# Patient Record
Sex: Female | Born: 1980 | Race: White | Hispanic: No | Smoking: Former smoker
Health system: Southern US, Community
[De-identification: ages and names within clinical notes are randomized; demographics above are authoritative.]

## PROBLEM LIST (undated history)

## (undated) ENCOUNTER — Inpatient Hospital Stay (HOSPITAL_COMMUNITY): Payer: Self-pay

## (undated) DIAGNOSIS — R87629 Unspecified abnormal cytological findings in specimens from vagina: Secondary | ICD-10-CM

## (undated) DIAGNOSIS — G43909 Migraine, unspecified, not intractable, without status migrainosus: Secondary | ICD-10-CM

## (undated) DIAGNOSIS — B009 Herpesviral infection, unspecified: Secondary | ICD-10-CM

## (undated) HISTORY — DX: Unspecified abnormal cytological findings in specimens from vagina: R87.629

## (undated) HISTORY — PX: WISDOM TOOTH EXTRACTION: SHX21

## (undated) HISTORY — PX: THERAPEUTIC ABORTION: SHX798

---

## 2006-03-28 ENCOUNTER — Emergency Department (HOSPITAL_COMMUNITY): Admission: EM | Admit: 2006-03-28 | Discharge: 2006-03-28 | Payer: Self-pay | Admitting: Emergency Medicine

## 2006-10-31 ENCOUNTER — Emergency Department (HOSPITAL_COMMUNITY): Admission: EM | Admit: 2006-10-31 | Discharge: 2006-10-31 | Payer: Self-pay | Admitting: Emergency Medicine

## 2007-03-12 ENCOUNTER — Emergency Department (HOSPITAL_COMMUNITY): Admission: EM | Admit: 2007-03-12 | Discharge: 2007-03-12 | Payer: Self-pay | Admitting: Emergency Medicine

## 2007-09-27 ENCOUNTER — Emergency Department (HOSPITAL_COMMUNITY): Admission: EM | Admit: 2007-09-27 | Discharge: 2007-09-27 | Payer: Self-pay | Admitting: Emergency Medicine

## 2007-10-12 ENCOUNTER — Emergency Department (HOSPITAL_COMMUNITY): Admission: EM | Admit: 2007-10-12 | Discharge: 2007-10-13 | Payer: Self-pay | Admitting: Emergency Medicine

## 2007-10-15 ENCOUNTER — Telehealth: Payer: Self-pay | Admitting: *Deleted

## 2007-10-15 ENCOUNTER — Inpatient Hospital Stay (HOSPITAL_COMMUNITY): Admission: AD | Admit: 2007-10-15 | Discharge: 2007-10-15 | Payer: Self-pay | Admitting: Obstetrics & Gynecology

## 2007-10-15 LAB — CONVERTED CEMR LAB
Chlamydia, DNA Probe: NEGATIVE
GC Culture Only: NEGATIVE

## 2007-10-29 ENCOUNTER — Encounter: Payer: Self-pay | Admitting: Family Medicine

## 2007-10-29 ENCOUNTER — Ambulatory Visit: Payer: Self-pay | Admitting: Family Medicine

## 2007-10-29 LAB — CONVERTED CEMR LAB
Basophils Relative: 0 % (ref 0–1)
Eosinophils Absolute: 0.1 10*3/uL (ref 0.0–0.7)
Hepatitis B Surface Ag: NEGATIVE
Lymphs Abs: 2.3 10*3/uL (ref 0.7–4.0)
MCHC: 34.9 g/dL (ref 30.0–36.0)
MCV: 88 fL (ref 78.0–100.0)
Neutrophils Relative %: 64 % (ref 43–77)
Platelets: 200 10*3/uL (ref 150–400)
WBC: 8.9 10*3/uL (ref 4.0–10.5)

## 2007-11-05 ENCOUNTER — Encounter: Payer: Self-pay | Admitting: Family Medicine

## 2007-11-05 ENCOUNTER — Ambulatory Visit: Payer: Self-pay | Admitting: Family Medicine

## 2007-11-05 LAB — CONVERTED CEMR LAB

## 2007-11-06 ENCOUNTER — Encounter: Payer: Self-pay | Admitting: Family Medicine

## 2007-11-08 ENCOUNTER — Telehealth: Payer: Self-pay | Admitting: *Deleted

## 2007-11-25 ENCOUNTER — Telehealth: Payer: Self-pay | Admitting: *Deleted

## 2007-11-29 ENCOUNTER — Encounter: Payer: Self-pay | Admitting: Family Medicine

## 2007-11-29 ENCOUNTER — Ambulatory Visit (HOSPITAL_COMMUNITY): Admission: RE | Admit: 2007-11-29 | Discharge: 2007-11-29 | Payer: Self-pay | Admitting: Family Medicine

## 2007-12-09 ENCOUNTER — Ambulatory Visit: Payer: Self-pay | Admitting: Family Medicine

## 2007-12-09 ENCOUNTER — Encounter: Payer: Self-pay | Admitting: Family Medicine

## 2007-12-29 ENCOUNTER — Inpatient Hospital Stay (HOSPITAL_COMMUNITY): Admission: AD | Admit: 2007-12-29 | Discharge: 2007-12-30 | Payer: Self-pay | Admitting: Gynecology

## 2008-01-01 ENCOUNTER — Ambulatory Visit: Payer: Self-pay | Admitting: Family Medicine

## 2008-01-02 ENCOUNTER — Telehealth: Payer: Self-pay | Admitting: *Deleted

## 2008-01-02 ENCOUNTER — Encounter: Payer: Self-pay | Admitting: *Deleted

## 2008-01-06 ENCOUNTER — Telehealth: Payer: Self-pay | Admitting: *Deleted

## 2008-01-06 ENCOUNTER — Ambulatory Visit (HOSPITAL_COMMUNITY): Admission: RE | Admit: 2008-01-06 | Discharge: 2008-01-06 | Payer: Self-pay | Admitting: Family Medicine

## 2008-01-08 ENCOUNTER — Ambulatory Visit (HOSPITAL_COMMUNITY): Admission: RE | Admit: 2008-01-08 | Discharge: 2008-01-08 | Payer: Self-pay | Admitting: Family Medicine

## 2008-01-10 ENCOUNTER — Telehealth: Payer: Self-pay | Admitting: *Deleted

## 2008-01-14 ENCOUNTER — Encounter: Payer: Self-pay | Admitting: Family Medicine

## 2008-01-28 ENCOUNTER — Encounter: Payer: Self-pay | Admitting: Family Medicine

## 2008-01-28 ENCOUNTER — Ambulatory Visit: Payer: Self-pay | Admitting: Family Medicine

## 2008-02-17 ENCOUNTER — Telehealth: Payer: Self-pay | Admitting: Psychology

## 2008-03-03 ENCOUNTER — Encounter: Payer: Self-pay | Admitting: Family Medicine

## 2008-03-03 ENCOUNTER — Ambulatory Visit: Payer: Self-pay | Admitting: Family Medicine

## 2008-03-03 LAB — CONVERTED CEMR LAB: Glucose, Urine, Semiquant: NEGATIVE

## 2008-03-09 ENCOUNTER — Encounter: Payer: Self-pay | Admitting: Family Medicine

## 2008-03-09 ENCOUNTER — Ambulatory Visit: Payer: Self-pay | Admitting: Family Medicine

## 2008-03-23 ENCOUNTER — Telehealth: Payer: Self-pay | Admitting: Family Medicine

## 2008-03-25 ENCOUNTER — Telehealth: Payer: Self-pay | Admitting: *Deleted

## 2008-03-26 ENCOUNTER — Ambulatory Visit: Payer: Self-pay | Admitting: Family Medicine

## 2008-03-26 LAB — CONVERTED CEMR LAB

## 2008-03-31 ENCOUNTER — Telehealth: Payer: Self-pay | Admitting: Family Medicine

## 2008-04-03 ENCOUNTER — Telehealth: Payer: Self-pay | Admitting: *Deleted

## 2008-04-08 ENCOUNTER — Encounter: Payer: Self-pay | Admitting: Family Medicine

## 2008-04-08 ENCOUNTER — Ambulatory Visit: Payer: Self-pay | Admitting: Family Medicine

## 2008-04-08 LAB — CONVERTED CEMR LAB
Glucose, Urine, Semiquant: NEGATIVE
MCHC: 33.5 g/dL (ref 30.0–36.0)
MCV: 90 fL (ref 78.0–100.0)
Platelets: 189 10*3/uL (ref 150–400)

## 2008-04-13 ENCOUNTER — Ambulatory Visit (HOSPITAL_COMMUNITY): Admission: RE | Admit: 2008-04-13 | Discharge: 2008-04-13 | Payer: Self-pay | Admitting: Family Medicine

## 2008-04-13 ENCOUNTER — Encounter: Payer: Self-pay | Admitting: Family Medicine

## 2008-04-22 ENCOUNTER — Encounter: Payer: Self-pay | Admitting: Family Medicine

## 2008-04-24 ENCOUNTER — Telehealth: Payer: Self-pay | Admitting: *Deleted

## 2008-04-27 ENCOUNTER — Ambulatory Visit: Payer: Self-pay | Admitting: Family Medicine

## 2008-04-28 ENCOUNTER — Telehealth: Payer: Self-pay | Admitting: *Deleted

## 2008-05-13 ENCOUNTER — Encounter: Payer: Self-pay | Admitting: Family Medicine

## 2008-05-13 ENCOUNTER — Ambulatory Visit: Payer: Self-pay | Admitting: Family Medicine

## 2008-05-13 LAB — CONVERTED CEMR LAB: Chlamydia, DNA Probe: NEGATIVE

## 2008-05-14 ENCOUNTER — Encounter: Payer: Self-pay | Admitting: Family Medicine

## 2008-05-18 ENCOUNTER — Telehealth: Payer: Self-pay | Admitting: Family Medicine

## 2008-05-19 ENCOUNTER — Encounter: Payer: Self-pay | Admitting: Family Medicine

## 2008-05-19 ENCOUNTER — Ambulatory Visit: Payer: Self-pay | Admitting: Family Medicine

## 2008-05-20 ENCOUNTER — Ambulatory Visit (HOSPITAL_COMMUNITY): Admission: RE | Admit: 2008-05-20 | Discharge: 2008-05-20 | Payer: Self-pay | Admitting: Family Medicine

## 2008-05-20 ENCOUNTER — Telehealth: Payer: Self-pay | Admitting: Family Medicine

## 2008-05-22 ENCOUNTER — Telehealth (INDEPENDENT_AMBULATORY_CARE_PROVIDER_SITE_OTHER): Payer: Self-pay | Admitting: Family Medicine

## 2008-05-29 ENCOUNTER — Ambulatory Visit: Payer: Self-pay | Admitting: Family Medicine

## 2008-06-05 ENCOUNTER — Ambulatory Visit: Payer: Self-pay | Admitting: Family Medicine

## 2008-06-11 ENCOUNTER — Encounter (INDEPENDENT_AMBULATORY_CARE_PROVIDER_SITE_OTHER): Payer: Self-pay | Admitting: *Deleted

## 2008-06-12 ENCOUNTER — Ambulatory Visit: Payer: Self-pay | Admitting: Obstetrics & Gynecology

## 2008-06-12 ENCOUNTER — Inpatient Hospital Stay (HOSPITAL_COMMUNITY): Admission: RE | Admit: 2008-06-12 | Discharge: 2008-06-16 | Payer: Self-pay | Admitting: Family Medicine

## 2008-06-13 ENCOUNTER — Encounter: Payer: Self-pay | Admitting: Obstetrics & Gynecology

## 2008-06-22 ENCOUNTER — Telehealth: Payer: Self-pay | Admitting: Family Medicine

## 2008-07-02 ENCOUNTER — Telehealth: Payer: Self-pay | Admitting: *Deleted

## 2008-07-23 ENCOUNTER — Ambulatory Visit: Payer: Self-pay | Admitting: Family Medicine

## 2008-09-02 ENCOUNTER — Telehealth: Payer: Self-pay | Admitting: Family Medicine

## 2008-09-03 ENCOUNTER — Ambulatory Visit: Payer: Self-pay | Admitting: Family Medicine

## 2008-09-03 DIAGNOSIS — G43009 Migraine without aura, not intractable, without status migrainosus: Secondary | ICD-10-CM | POA: Insufficient documentation

## 2008-09-15 ENCOUNTER — Telehealth: Payer: Self-pay | Admitting: Family Medicine

## 2008-09-21 ENCOUNTER — Telehealth (INDEPENDENT_AMBULATORY_CARE_PROVIDER_SITE_OTHER): Payer: Self-pay | Admitting: *Deleted

## 2008-10-12 ENCOUNTER — Ambulatory Visit: Payer: Self-pay | Admitting: Family Medicine

## 2008-10-12 DIAGNOSIS — E669 Obesity, unspecified: Secondary | ICD-10-CM

## 2008-10-12 DIAGNOSIS — N912 Amenorrhea, unspecified: Secondary | ICD-10-CM

## 2008-10-12 LAB — CONVERTED CEMR LAB
Beta hcg, urine, semiquantitative: NEGATIVE
Blood in Urine, dipstick: NEGATIVE
Glucose, Urine, Semiquant: NEGATIVE
Nitrite: NEGATIVE
Specific Gravity, Urine: 1.02
pH: 5.5

## 2008-12-16 ENCOUNTER — Ambulatory Visit: Payer: Self-pay | Admitting: Family Medicine

## 2008-12-16 ENCOUNTER — Encounter: Payer: Self-pay | Admitting: Family Medicine

## 2008-12-16 ENCOUNTER — Telehealth (INDEPENDENT_AMBULATORY_CARE_PROVIDER_SITE_OTHER): Payer: Self-pay | Admitting: *Deleted

## 2008-12-16 ENCOUNTER — Other Ambulatory Visit: Admission: RE | Admit: 2008-12-16 | Discharge: 2008-12-16 | Payer: Self-pay | Admitting: Family Medicine

## 2008-12-16 DIAGNOSIS — N898 Other specified noninflammatory disorders of vagina: Secondary | ICD-10-CM | POA: Insufficient documentation

## 2008-12-16 DIAGNOSIS — M79609 Pain in unspecified limb: Secondary | ICD-10-CM

## 2008-12-16 DIAGNOSIS — K5900 Constipation, unspecified: Secondary | ICD-10-CM | POA: Insufficient documentation

## 2008-12-16 LAB — CONVERTED CEMR LAB: Whiff Test: NEGATIVE

## 2008-12-19 ENCOUNTER — Telehealth: Payer: Self-pay | Admitting: Family Medicine

## 2008-12-30 ENCOUNTER — Telehealth: Payer: Self-pay | Admitting: Family Medicine

## 2009-01-21 ENCOUNTER — Telehealth: Payer: Self-pay | Admitting: *Deleted

## 2009-08-12 ENCOUNTER — Emergency Department (HOSPITAL_COMMUNITY): Admission: EM | Admit: 2009-08-12 | Discharge: 2009-08-13 | Payer: Self-pay | Admitting: Emergency Medicine

## 2009-09-16 ENCOUNTER — Encounter: Payer: Self-pay | Admitting: Family Medicine

## 2009-09-17 ENCOUNTER — Telehealth: Payer: Self-pay | Admitting: *Deleted

## 2010-07-18 ENCOUNTER — Encounter: Payer: Self-pay | Admitting: Family Medicine

## 2010-07-26 NOTE — Miscellaneous (Signed)
  Clinical Lists Changes  Medications: Removed medication of AMITRIPTYLINE HCL 25 MG TABS (AMITRIPTYLINE HCL) Take one tablet at bedtime Removed medication of FIORICET 50-325-40 MG TABS (BUTALBITAL-APAP-CAFFEINE) one to two tabs by mouth q4 hours as needed headache. max 6 tabs in 24 hours. Removed medication of ZOMIG 2.5 MG TABS (ZOLMITRIPTAN) one tab by mouth q2 hours as needed Migraine. max 4 tabs in 24 hours. dispense 1 month supply

## 2010-07-26 NOTE — Progress Notes (Signed)
Summary: refill  Phone Note Refill Request Call back at Home Phone [phone] Message from:  Patient  Refills Requested: Medication #1:  ORTHO-CEPT (28) 0.15-30 MG-MCG TABS one tab by mouth daily per package instructions. dispense 1 pack. [facility] [phone]  Initial call taken by: De Nurse,  September 17, 2009 10:10 AM  Follow-up for Phone Call        please call in to pharmacy Follow-up by: Lequita Asal  MD,  September 17, 2009 12:55 PM    Prescriptions: ORTHO-CEPT (28) 0.15-30 MG-MCG TABS (DESOGESTREL-ETHINYL ESTRADIOL) one tab by mouth daily per package instructions. dispense 1 pack  #1 x 3   Entered and Authorized by:   Lequita Asal  MD   Signed by:   Lequita Asal  MD on 09/17/2009   Method used:   Telephoned to ...       [facility] (retail)       [address]       [place], [place]  [address]       Ph: [phone] or [phone]       Fax: [phone]   RxID:   [id number]  rx phone in and pt called.Loralee Pacas CMA  September 17, 2009 2:04 PM

## 2010-11-08 NOTE — Op Note (Signed)
[name], [name]                ACCOUNT NO.:  [id number]   MEDICAL RECORD NO.:  [id number]          PATIENT TYPE:  INP   LOCATION:  9102                          [facility]ILITY:  [facility]   PHYSICIAN:  Norton Blizzard, MD    DATE OF BIRTH:  10-28-1980   DATE OF PROCEDURE:  06/13/2008  DATE OF DISCHARGE:                               OPERATIVE REPORT   PREOPERATIVE DIAGNOSES:  1. Intrauterine pregnancy at 2 and 2/7th weeks' gestation.  2. Failed induction of labor for post dates.  3. Transverse presentation.   POSTOPERATIVE DIAGNOSES:  1. Intrauterine pregnancy at 70 and 2/7th weeks' gestation.  2. Failed induction of labor for post dates.  3. Transverse presentation.   PROCEDURES:  1. Primary low transverse cesarean section via Pfannenstiel incision.  2. Internal cephalic version.   SURGEON:  Norton Blizzard, MD.   ASSISTANT:  Cam Hai, Certified Nurse Midwife.   ANESTHESIA:  Spinal.   IV FLUIDS:  2100 mL of lactated Ringer's.   URINE OUTPUT:  200 mL.   ESTIMATED BLOOD LOSS:  900 mL.   INDICATIONS:  The patient is a 30 year old gravida 6, para 1-0-4-1 who  was admitted at 60 and 2/7th weeks for induction for post dates and  variable lie.  At the time of her admission, the patient was noted to  have a transverse fetal presentation with an anterior placenta.  She  underwent external cephalic version, which was reinforced with an  abdominal binder and induction with Cytotec commenced.  Throughout the  day, the patient progressed to a cervical dilatation of 1 cm, but the  fetal lie was noted to go back to a transverse presentation.  Given the  fetal variable presentation, the patient was counseled regarding the  need of cesarean section.  The patient was also given the option given  that she was only 40 and 2/7th weeks, to return in a week to retry  induction of labor, and retry external cephalic version if the fetus was  still transverse at this time.  The patient wanted to  proceed with  primary cesarean section today.  The risks of cesarean section including  but not limited to bleeding that may require transfusion, infection that  may require antibiotics, injury to surrounding organs, and need for  additional procedures including hysterectomy in the event of a life-  threatening bleed, risk of having a vertical incision on the uterus,  which would negate further vaginal deliveries after this delivery were  explained to the patient and written informed consent was obtained.   FINDINGS:  Viable female infant in transverse presentation, vertex on  the left, back down.  This was successfully converted to a cephalic  presentation after an internal cephalic version prior to delivery.  Apgars of the baby were 8 and 9, weight 8 pounds 2 ounces.  There was  spontaneous placental delivery, intact with 3-vessel cord.  Normal  uterus, fallopian tubes, and ovaries bilaterally.   SPECIMENS:  Placenta.   DISPOSITION:  Specimens to Pathology.   COMPLICATIONS:  None immediately after the procedure.   PROCEDURE DETAILS:  The patient was taken to the operating room where  spinal anesthesia was administered and found to be adequate.  She was  also given 2 g of Ancef for infection prophylaxis.  The patient was then  placed in a dorsal supine position with a leftward tilt, and prepped and  draped in a sterile manner.  A Foley catheter was inserted into the  patient's bladder and attached to constant gravity.  Attention was then  turned to the patient's abdomen where a Pfannenstiel incision was made  with a scalpel and carried through to the underlying layer of fascia.  The fascia was incised in midline and this incision was extended  bilaterally using Mayo scissors.  Kochers were then applied to the  superior aspect of this fascial incision and the underlying rectus  muscles were dissected off sharply and bluntly.  A similar process was  carried out on inferior aspect of  the fascial incision.  The rectus  muscles were separated in the midline bluntly and the peritoneum was  entered bluntly.  The perineal incision was extended superiorly and  inferiorly with good visualization of bowel and bladder.  At this point,  an internal cephalic version was done moving the fetal head from the  maternal left position into a cephalic presentation.  A bladder flap was  then done on the lower uterine segment and a transverse hysterotomy was  made using a scalpel and extended bilaterally in a blunt fashion.  Of  note, during the time between the version and when the hysterotomy was  completed, the infant was noted to go back into a transverse position,  so prior to the membranes being ruptured, another version was done to  bring the fetal head down towards the incision and this was successful.  The membranes were then ruptured for clear fluid and the infant was  delivered in the cephalic presentation without any complication.  The  cord was clamped and cut ,and the infant was handed over to the awaiting  neonatologist.  Cord blood was collected according to protocol.  The  placenta was noted to deliver spontaneously and had a 3-vessel cord.  The uterus was exteriorized and cleared of all clots and debris.  The  hysterotomy was repaired with 0 Monocryl in a running locked fashion.  A  second layer of Monocryl was used as an imbricating layer to obtain  hemostasis.  Of note, there was some bleeding around the bladder flap  area, so the bladder flap was reapproximated to help with this bleeding.  Overall, good hemostasis was noted.  The uterus was returned to the  abdomen.  The pelvic gutters were cleared of all clot and debris and  hemostasis was confirmed on all surfaces.  The fascia was then  reapproximated using 0 Vicryl in a running fashion, and the skin was  closed with staples and a pressure dressing was placed at the end of the  case.  The patient tolerated the  procedure well.  Sponge, needle, and  instrument counts were correct  x 2.  She was taken to the recovery  room, awake, and in stable condition.      Norton Blizzard, MD  Electronically Signed     UAD/MEDQ  D:  06/13/2008  T:  06/13/2008  Job:  [id number]

## 2010-11-11 NOTE — Discharge Summary (Signed)
Amy [name], Amy [name]                ACCOUNT NO.:  [id number]   MEDICAL RECORD NO.:  [id number]          PATIENT TYPE:  INP   LOCATION:  9102                          [facility]ILITY:  [facility]   PHYSICIAN:  Lazaro Arms, M.D.   DATE OF BIRTH:  [birth year]   DATE OF ADMISSION:  06/12/2008  DATE OF DISCHARGE:  06/16/2008                               DISCHARGE SUMMARY   PRIMARY CARE Alline Pio:  Dr. Lequita Asal of [facility].   ATTENDING PHYSICIAN:  Dr. Duane Lope   ADMISSION DIAGNOSES:  1. Intrauterine pregnancy at 40 weeks and 1 day gestational age.  2. Induction of labor secondary to postdates, gestational age.   DISCHARGE DIAGNOSES:  1. Postoperative day #3, status post primary low transverse cesarean      section at 40 weeks and 2 days.  2. Intrauterine pregnancy with fetus in transverse position.   PROCEDURES:  1. Failed induction of labor secondary to postdates.  2. Cephalic version of the fetus.  3. Internal cephalic version of the fetus.  4. Primary low transverse cesarean section secondary to unstable      lie/transverse presentation.   HISTORY OF PRESENT ILLNESS:  The patient is a 30 year old G6, P2-0-4-2,  was admitted at 40 weeks and 1 day gestational age for induction of  labor, for postdates.  The patient was felt having a nonfavorable  cervix.  Upon cervical exam, because the fetal presenting part was  unable tdeterminedmined, with bedside ultrasound, patient's fetus was  found to be in transverse position.  External version was attempted and  successful by Dr. Despina Hidden and the patient was placed in abdominal binder  overnight.  Cytotec was placed x2 to ripen the cervix.  On the morning  of June 13, 2008, prior to placement of Foley bulb bedside  ultrasound was repeated and fetus was again noted to be in the tranverse  position.  Options were discussed with the patient and she decided to  continue with a cesarean section secondary to unstable lie.  The  patient  and fetus tolerated the procedure well.  The patient delivered a viable  female infant with Apgars 8 and 9, weight of 8 pounds 2 ounces.  The  patient had an unremarkable postoperative course.  She was deemed stable  for discharge on postoperative day #3.  She was breastfeeding and had  not yet decided upon the mode of birth control.  She is followed at the  [facility] at 6 weeks.   DISCHARGE MEDICATIONS:  1. Percocet 5/325 one to two tablets p.o. q.4-6 h. p.r.n. pain.  2. Colace 100 mg p.o. b.i.d.  3. Ibuprofen 600 mg p.o. q.6 h. p.r.n. pain.   DISCHARGE INSTRUCTIONS:  No heavy lifting x6 weeks, nothing  intravaginally x6 weeks.      Lauro Franklin, MD      Lazaro Arms, M.D.  Electronically Signed    TCB/MEDQ  D:  06/18/2008  T:  06/19/2008  Job:  [id number]

## 2011-03-21 LAB — WET PREP, GENITAL: Yeast Wet Prep HPF POC: NONE SEEN

## 2011-03-21 LAB — CBC
HCT: 37.9
MCV: 87
Platelets: 226
RBC: 4.35
WBC: 9.4

## 2011-03-21 LAB — URINALYSIS, ROUTINE W REFLEX MICROSCOPIC
Nitrite: NEGATIVE
Protein, ur: NEGATIVE
Specific Gravity, Urine: >1.03 — ABNORMAL HIGH
Urobilinogen, UA: 0.2

## 2011-03-21 LAB — POCT PREGNANCY, URINE
Operator id: 22226
Preg Test, Ur: POSITIVE

## 2011-03-21 LAB — HCG, QUANTITATIVE, PREGNANCY: hCG, Beta Chain, Quant, S: 34010 — ABNORMAL HIGH

## 2011-03-23 LAB — CBC
HCT: 35.4 — ABNORMAL LOW
Hemoglobin: 12.4
MCHC: 35.1
MCV: 91
RBC: 3.89
WBC: 9.9

## 2011-03-28 LAB — GLUCOSE, CAPILLARY: Glucose-Capillary: 104 — ABNORMAL HIGH

## 2011-03-31 LAB — CBC
HCT: 27.4 % — ABNORMAL LOW (ref 36.0–46.0)
HCT: 36.2 % (ref 36.0–46.0)
Hemoglobin: 12.5 g/dL (ref 12.0–15.0)
Hemoglobin: 9.6 g/dL — ABNORMAL LOW (ref 12.0–15.0)
Hemoglobin: 9.7 g/dL — ABNORMAL LOW (ref 12.0–15.0)
MCHC: 34.4 g/dL (ref 30.0–36.0)
MCHC: 35 g/dL (ref 30.0–36.0)
MCV: 92.1 fL (ref 78.0–100.0)
MCV: 92.6 fL (ref 78.0–100.0)
Platelets: 125 10*3/uL — ABNORMAL LOW (ref 150–400)
Platelets: 134 10*3/uL — ABNORMAL LOW (ref 150–400)
RBC: 3.93 MIL/uL (ref 3.87–5.11)
RDW: 14.1 % (ref 11.5–15.5)
RDW: 14.4 % (ref 11.5–15.5)
WBC: 11.3 10*3/uL — ABNORMAL HIGH (ref 4.0–10.5)

## 2011-03-31 LAB — RPR: RPR Ser Ql: NONREACTIVE

## 2012-09-24 ENCOUNTER — Emergency Department (HOSPITAL_COMMUNITY)
Admission: EM | Admit: 2012-09-24 | Discharge: 2012-09-24 | Disposition: A | Discharge status: Home / Self Care | Payer: Self-pay | Attending: Emergency Medicine | Admitting: Emergency Medicine

## 2012-09-24 ENCOUNTER — Encounter (HOSPITAL_COMMUNITY): Payer: Self-pay | Admitting: Emergency Medicine

## 2012-09-24 DIAGNOSIS — Z87891 Personal history of nicotine dependence: Secondary | ICD-10-CM | POA: Insufficient documentation

## 2012-09-24 DIAGNOSIS — E860 Dehydration: Secondary | ICD-10-CM | POA: Insufficient documentation

## 2012-09-24 DIAGNOSIS — R109 Unspecified abdominal pain: Secondary | ICD-10-CM | POA: Insufficient documentation

## 2012-09-24 DIAGNOSIS — R112 Nausea with vomiting, unspecified: Secondary | ICD-10-CM | POA: Insufficient documentation

## 2012-09-24 DIAGNOSIS — R197 Diarrhea, unspecified: Secondary | ICD-10-CM | POA: Insufficient documentation

## 2012-09-24 HISTORY — DX: Migraine, unspecified, not intractable, without status migrainosus: G43.909

## 2012-09-24 MED ORDER — SODIUM CHLORIDE 0.9 % IV BOLUS (SEPSIS)
1000.0000 mL | Freq: Once | INTRAVENOUS | Status: AC
  Administered 2012-09-24: 1000 mL via INTRAVENOUS

## 2012-09-24 MED ORDER — PROMETHAZINE HCL 25 MG PO TABS: 25.0000 mg | ORAL_TABLET | Freq: Four times a day (QID) | ORAL | Status: DC | PRN

## 2012-09-24 MED ORDER — DIPHENOXYLATE-ATROPINE 2.5-0.025 MG PO TABS: 1.0000 | ORAL_TABLET | Freq: Four times a day (QID) | ORAL | Status: DC | PRN

## 2012-09-24 NOTE — ED Provider Notes (Signed)
History     CSN: [id number]  Arrival date & time 09/24/12  1312   First MD Initiated Contact with Patient 09/24/12 1341      No chief complaint on file.   (Consider location/radiation/quality/duration/timing/severity/associated sxs/prior treatment) HPI Comments: Patient presents to the ER for evaluation of diarrhea and abdominal cramping. Symptoms began last night. Patient reports that she was up all night with watery diarrhea. She has nausea but no vomiting. Patient's daughter is seen today for GI symptoms as well. Wife reports that her husband also was recently sick with the same symptoms. Patient does feel somewhat better today, but was unable to go to work.   No past medical history on file.  No past surgical history on file.  No family history on file.  History  Substance Use Topics  . Smoking status: Not on file  . Smokeless tobacco: Not on file  . Alcohol Use: Not on file    OB History   No data available      Review of Systems  Constitutional: Negative for fever.  Gastrointestinal: Positive for nausea and diarrhea. Negative for vomiting and anal bleeding.  All other systems reviewed and are negative.    Allergies  Review of patient's allergies indicates no known allergies.  Home Medications   Current Outpatient Rx  Name  Route  Sig  Dispense  Refill  . Attapulgite (KAOPECTATE PO)   Oral   Take 2 tablets by mouth every 6 (six) hours as needed (UPSET STOMACH).           BP 114/79  Pulse 125  Temp(Src) 98.2 F (36.8 C) (Oral)  Resp 20  SpO2 99%  Physical Exam  Constitutional: She is oriented to person, place, and time. She appears well-developed and well-nourished. No distress.  HENT:  Head: Normocephalic and atraumatic.  Right Ear: Hearing normal.  Nose: Nose normal.  Mouth/Throat: Oropharynx is clear and moist and mucous membranes are normal.  Eyes: Conjunctivae and EOM are normal. Pupils are equal, round, and reactive to light.  Neck:  Normal range of motion. Neck supple.  Cardiovascular: Normal rate, regular rhythm, S1 normal and S2 normal.  Exam reveals no gallop and no friction rub.   No murmur heard. Pulmonary/Chest: Effort normal and breath sounds normal. No respiratory distress. She exhibits no tenderness.  Abdominal: Soft. Normal appearance and bowel sounds are normal. There is no hepatosplenomegaly. There is no tenderness. There is no rebound, no guarding, no tenderness at McBurney's point and negative Murphy's sign. No hernia.  Musculoskeletal: Normal range of motion.  Neurological: She is alert and oriented to person, place, and time. She has normal strength. No cranial nerve deficit or sensory deficit. Coordination normal. GCS eye subscore is 4. GCS verbal subscore is 5. GCS motor subscore is 6.  Skin: Skin is warm, dry and intact. No rash noted. No cyanosis.  Psychiatric: She has a normal mood and affect. Her speech is normal and behavior is normal. Thought content normal.    ED Course  Procedures (including critical care time)  Labs Reviewed - No data to display No results found.   Diagnosis: Diarrhea    MDM  Patient presents to the ER for voluminous watery nonbloody diarrhea through the night. Patient has 2 other family members in the home with similar GI symptoms. This is consistent with a viral process. There is a very prevalent viral gastroenteritis in the community currently. Patient's abdominal exam was benign and nontender. Clinically her pressure was normal. With orthostatic  blood pressures, blood pressure did not change, but her heart rate did significantly increase, indicating mild to moderate dehydration. Patient was therefore administered a liter of IV fluids.        Gilda Crease, MD 09/24/12 1438

## 2012-09-24 NOTE — ED Notes (Signed)
Pt here for diarrhea/vomiting x3 day not relieved with Otc meds

## 2015-09-19 ENCOUNTER — Encounter (HOSPITAL_COMMUNITY): Payer: Self-pay | Admitting: *Deleted

## 2015-09-19 ENCOUNTER — Inpatient Hospital Stay (HOSPITAL_COMMUNITY)
Admission: AD | Admit: 2015-09-19 | Discharge: 2015-09-19 | Disposition: A | Discharge status: Home / Self Care | Payer: Medicaid Other | Source: Ambulatory Visit | Attending: Family Medicine | Admitting: Family Medicine

## 2015-09-19 ENCOUNTER — Inpatient Hospital Stay (HOSPITAL_COMMUNITY): Payer: Medicaid Other

## 2015-09-19 DIAGNOSIS — Z87891 Personal history of nicotine dependence: Secondary | ICD-10-CM | POA: Insufficient documentation

## 2015-09-19 DIAGNOSIS — G43909 Migraine, unspecified, not intractable, without status migrainosus: Secondary | ICD-10-CM | POA: Insufficient documentation

## 2015-09-19 DIAGNOSIS — Z3A15 15 weeks gestation of pregnancy: Secondary | ICD-10-CM | POA: Diagnosis not present

## 2015-09-19 DIAGNOSIS — O209 Hemorrhage in early pregnancy, unspecified: Secondary | ICD-10-CM | POA: Insufficient documentation

## 2015-09-19 DIAGNOSIS — O021 Missed abortion: Secondary | ICD-10-CM

## 2015-09-19 DIAGNOSIS — O4692 Antepartum hemorrhage, unspecified, second trimester: Secondary | ICD-10-CM

## 2015-09-19 DIAGNOSIS — N939 Abnormal uterine and vaginal bleeding, unspecified: Secondary | ICD-10-CM | POA: Diagnosis present

## 2015-09-19 LAB — URINALYSIS, ROUTINE W REFLEX MICROSCOPIC
Bilirubin Urine: NEGATIVE
Glucose, UA: NEGATIVE mg/dL
Ketones, ur: NEGATIVE mg/dL
NITRITE: NEGATIVE
Protein, ur: NEGATIVE mg/dL
SPECIFIC GRAVITY, URINE: 1.02 (ref 1.005–1.030)
pH: 6 (ref 5.0–8.0)

## 2015-09-19 LAB — URINE MICROSCOPIC-ADD ON

## 2015-09-19 LAB — WET PREP, GENITAL
Sperm: NONE SEEN
Trich, Wet Prep: NONE SEEN
Yeast Wet Prep HPF POC: NONE SEEN

## 2015-09-19 LAB — POCT PREGNANCY, URINE: Preg Test, Ur: POSITIVE — AB

## 2015-09-19 LAB — CBC
HEMATOCRIT: 39.5 % (ref 36.0–46.0)
HEMOGLOBIN: 13.6 g/dL (ref 12.0–15.0)
MCH: 30.2 pg (ref 26.0–34.0)
MCHC: 34.4 g/dL (ref 30.0–36.0)
MCV: 87.6 fL (ref 78.0–100.0)
Platelets: 217 10*3/uL (ref 150–400)
RBC: 4.51 MIL/uL (ref 3.87–5.11)
RDW: 13.1 % (ref 11.5–15.5)
WBC: 9 10*3/uL (ref 4.0–10.5)

## 2015-09-19 NOTE — Discharge Instructions (Signed)
Miscarriage  A miscarriage is the sudden loss of an unborn baby (fetus) before the 20th week of pregnancy. Most miscarriages happen in the first 3 months of pregnancy. Sometimes, it happens before a woman even knows she is pregnant. A miscarriage is also called a "spontaneous miscarriage" or "early pregnancy loss." Having a miscarriage can be an emotional experience. Talk with your caregiver about any questions you may have about miscarrying, the grieving process, and your future pregnancy plans.  CAUSES    Problems with the fetal chromosomes that make it impossible for the baby to develop normally. Problems with the baby's genes or chromosomes are most often the result of errors that occur, by chance, as the embryo divides and grows. The problems are not inherited from the parents.   Infection of the cervix or uterus.    Hormone problems.    Problems with the cervix, such as having an incompetent cervix. This is when the tissue in the cervix is not strong enough to hold the pregnancy.    Problems with the uterus, such as an abnormally shaped uterus, uterine fibroids, or congenital abnormalities.    Certain medical conditions.    Smoking, drinking alcohol, or taking illegal drugs.    Trauma.   Often, the cause of a miscarriage is unknown.   SYMPTOMS    Vaginal bleeding or spotting, with or without cramps or pain.   Pain or cramping in the abdomen or lower back.   Passing fluid, tissue, or blood clots from the vagina.  DIAGNOSIS   Your caregiver will perform a physical exam. You may also have an ultrasound to confirm the miscarriage. Blood or urine tests may also be ordered.  TREATMENT    Sometimes, treatment is not necessary if you naturally pass all the fetal tissue that was in the uterus. If some of the fetus or placenta remains in the body (incomplete miscarriage), tissue left behind may become infected and must be removed. Usually, a dilation and curettage (D and C) procedure is performed.  During a D and C procedure, the cervix is widened (dilated) and any remaining fetal or placental tissue is gently removed from the uterus.   Antibiotic medicines are prescribed if there is an infection. Other medicines may be given to reduce the size of the uterus (contract) if there is a lot of bleeding.   If you have Rh negative blood and your baby was Rh positive, you will need a Rh immunoglobulin shot. This shot will protect any future baby from having Rh blood problems in future pregnancies.  HOME CARE INSTRUCTIONS    Your caregiver may order bed rest or may allow you to continue light activity. Resume activity as directed by your caregiver.   Have someone help with home and family responsibilities during this time.    Keep track of the number of sanitary pads you use each day and how soaked (saturated) they are. Write down this information.    Do not use tampons. Do not douche or have sexual intercourse until approved by your caregiver.    Only take over-the-counter or prescription medicines for pain or discomfort as directed by your caregiver.    Do not take aspirin. Aspirin can cause bleeding.    Keep all follow-up appointments with your caregiver.    If you or your partner have problems with grieving, talk to your caregiver or seek counseling to help cope with the pregnancy loss. Allow enough time to grieve before trying to get pregnant again.     SEEK IMMEDIATE MEDICAL CARE IF:    You have severe cramps or pain in your back or abdomen.   You have a fever.   You pass large blood clots (walnut-sized or larger) ortissue from your vagina. Save any tissue for your caregiver to inspect.    Your bleeding increases.    You have a thick, bad-smelling vaginal discharge.   You become lightheaded, weak, or you faint.    You have chills.   MAKE SURE YOU:   Understand these instructions.   Will watch your condition.   Will get help right away if you are not doing well or get worse.     This  information is not intended to replace advice given to you by your health care provider. Make sure you discuss any questions you have with your health care provider.     Document Released: 12/06/2000 Document Revised: 10/07/2012 Document Reviewed: 08/01/2011  Elsevier Interactive Patient Education 2016 [employer]

## 2015-09-19 NOTE — MAU Note (Signed)
Pt reports some vaginal spotting that started today at 11:00. Had some cramping last night Had pregnancy confirmed at the health dept on Feb 23.(no heart tones today). Pt also c/o some swollen glands in her groin area and some pustules on her right buttock

## 2015-09-19 NOTE — MAU Provider Note (Signed)
History     CSN: [id number]  Arrival date and time: 09/19/15 1453   First Provider Initiated Contact with Patient 09/19/15 1537       Chief Complaint  Patient presents with  . Vaginal Bleeding   HPI  Amy [name] is a 35 y.o. [id number] at 5222w4d by LMP who presents for vaginal bleeding.  Reports episode of red blood on toilet paper this morning at 11 am. No blood since then. Denies n/v/d, constipation, abdominal pain, or vaginal discharge. Last intercourse was 2 weeks ago. [facility] confirmation. Has not started prenatal care d/t pending medicaid.    OB History    Gravida Para Term Preterm AB TAB SAB Ectopic Multiple Living   7 2 2  4  1   2       Past Medical History  Diagnosis Date  . Migraine     Past Surgical History  Procedure Laterality Date  . Cesarean section      Family History  Problem Relation Age of Onset  . Cancer Father     Social History  Substance Use Topics  . Smoking status: Former Smoker -- 0.50 packs/day  . Smokeless tobacco: Current User  . Alcohol Use: No    Allergies: No Known Allergies  Prescriptions prior to admission  Medication Sig Dispense Refill Last Dose  . Attapulgite (KAOPECTATE PO) Take 2 tablets by mouth every 6 (six) hours as needed (UPSET STOMACH).   09/24/2012 at Unknown  . diphenoxylate-atropine (LOMOTIL) 2.5-0.025 MG per tablet Take 1 tablet by mouth 4 (four) times daily as needed for diarrhea or loose stools. 30 tablet 0   . promethazine (PHENERGAN) 25 MG tablet Take 1 tablet (25 mg total) by mouth every 6 (six) hours as needed for nausea. 30 tablet 0     Review of Systems  Constitutional: Negative.   Gastrointestinal: Negative for nausea, vomiting, abdominal pain, diarrhea and constipation.  Genitourinary: Negative for dysuria.       + vaginal bleeding   Physical Exam   Blood pressure 138/81, pulse 87, temperature 98.2 F (36.8 C), temperature source Oral, resp. rate 18, last menstrual  period 06/02/2015.  Physical Exam  Nursing note and vitals reviewed. Constitutional: She is oriented to person, place, and time. She appears well-developed and well-nourished. No distress.  HENT:  Head: Normocephalic and atraumatic.  Eyes: Conjunctivae are normal. Right eye exhibits no discharge. Left eye exhibits no discharge. No scleral icterus.  Neck: Normal range of motion.  Cardiovascular: Normal rate, regular rhythm and normal heart sounds.   No murmur heard. Respiratory: Effort normal and breath sounds normal. No respiratory distress. She has no wheezes.  GI: Soft. Bowel sounds are normal. She exhibits no distension. There is no tenderness.  Genitourinary: Uterus is enlarged. Uterus is not tender.  Small amount of pink/brown blood Cervix closed  Neurological: She is alert and oriented to person, place, and time.  Skin: Skin is warm and dry. She is not diaphoretic.  Psychiatric: She has a normal mood and affect. Her behavior is normal. Judgment and thought content normal.    MAU Course  Procedures Results for orders placed or performed during the hospital encounter of 09/19/15 (from the past 24 hour(s))  Urinalysis, Routine w reflex microscopic (not [facility])     Status: Abnormal   Collection Time: 09/19/15  3:05 PM  Result Value Ref Range   Color, Urine YELLOW YELLOW   APPearance CLEAR CLEAR   Specific Gravity, Urine  1.020 1.005 - 1.030   pH 6.0 5.0 - 8.0   Glucose, UA NEGATIVE NEGATIVE mg/dL   Hgb urine dipstick SMALL (A) NEGATIVE   Bilirubin Urine NEGATIVE NEGATIVE   Ketones, ur NEGATIVE NEGATIVE mg/dL   Protein, ur NEGATIVE NEGATIVE mg/dL   Nitrite NEGATIVE NEGATIVE   Leukocytes, UA MODERATE (A) NEGATIVE  Urine microscopic-add on     Status: Abnormal   Collection Time: 09/19/15  3:05 PM  Result Value Ref Range   Squamous Epithelial / LPF 0-5 (A) NONE SEEN   WBC, UA 6-30 0 - 5 WBC/hpf   RBC / HPF 0-5 0 - 5 RBC/hpf   Bacteria, UA FEW (A) NONE SEEN  Pregnancy, urine  POC     Status: Abnormal   Collection Time: 09/19/15  3:28 PM  Result Value Ref Range   Preg Test, Ur POSITIVE (A) NEGATIVE  CBC     Status: None   Collection Time: 09/19/15  4:11 PM  Result Value Ref Range   WBC 9.0 4.0 - 10.5 K/uL   RBC 4.51 3.87 - 5.11 MIL/uL   Hemoglobin 13.6 12.0 - 15.0 g/dL   HCT 16.1 09.6 - 04.5 %   MCV 87.6 78.0 - 100.0 fL   MCH 30.2 26.0 - 34.0 pg   MCHC 34.4 30.0 - 36.0 g/dL   RDW 40.9 81.1 - 91.4 %   Platelets 217 150 - 400 K/uL  Wet prep, genital     Status: Abnormal   Collection Time: 09/19/15  4:30 PM  Result Value Ref Range   Yeast Wet Prep HPF POC NONE SEEN NONE SEEN   Trich, Wet Prep NONE SEEN NONE SEEN   Clue Cells Wet Prep HPF POC PRESENT (A) NONE SEEN   WBC, Wet Prep HPF POC FEW (A) NONE SEEN   Sperm NONE SEEN     MDM O positive RN unable to obtain FHT - will order ultrasound Ultrasound prelim shows fetus measuring 12 wks 1 day with absent cardiac activity Dr. Shawnie Pons notified - message sent to [place] to schedule D&C Assessment and Plan  A: 1. Missed abortion   2. Vaginal bleeding in pregnancy, second trimester     P: Discharge home Discussed return precautions [place] will call patient to schedule D&C Comfort pack given  Judeth Horn 09/19/2015, 3:35 PM

## 2015-09-20 DIAGNOSIS — O021 Missed abortion: Secondary | ICD-10-CM | POA: Diagnosis present

## 2015-09-20 LAB — GC/CHLAMYDIA PROBE AMP (~~LOC~~) NOT AT ARMC
CHLAMYDIA, DNA PROBE: NEGATIVE
NEISSERIA GONORRHEA: NEGATIVE

## 2015-09-20 LAB — RPR: RPR Ser Ql: NONREACTIVE

## 2015-09-20 LAB — HIV ANTIBODY (ROUTINE TESTING W REFLEX): HIV Screen 4th Generation wRfx: NONREACTIVE

## 2015-09-20 MED ORDER — DOXYCYCLINE HYCLATE 100 MG IV SOLR
100.0000 mg | INTRAVENOUS | Status: DC
  Filled 2015-09-20: qty 100

## 2015-09-20 NOTE — H&P (Signed)
  Amy [name] is an 35 y.o. [id number] female.   Chief Complaint: missed AB HPI: 12 wk 1 day missed AB needing definitive treatment.  Past Medical History  Diagnosis Date  . Migraine     Past Surgical History  Procedure Laterality Date  . Cesarean section      Family History  Problem Relation Age of Onset  . Cancer Father    Social History:  reports that she has quit smoking. She uses smokeless tobacco. She reports that she does not drink alcohol. Her drug history is not on file.  Allergies: No Known Allergies  No current facility-administered medications on file prior to encounter.   Current Outpatient Prescriptions on File Prior to Encounter  Medication Sig Dispense Refill  . acetaminophen (TYLENOL) 500 MG tablet Take 1,000 mg by mouth every 6 (six) hours as needed for mild pain or headache.    . Prenatal Vit-Fe Fumarate-FA (PRENATAL MULTIVITAMIN) TABS tablet Take 1 tablet by mouth daily.    . sodium chloride (OCEAN) 0.65 % SOLN nasal spray Place 1-2 sprays into both nostrils as needed for congestion.      A comprehensive review of systems was negative.  Last menstrual period 06/02/2015. General appearance: alert, cooperative and appears stated age Head: Normocephalic, without obvious abnormality, atraumatic Neck: supple, symmetrical, trachea midline Lungs: normal effort Heart: regular rate and rhythm Abdomen: soft, non-tender; bowel sounds normal; no masses,  no organomegaly Extremities: extremities normal, atraumatic, no cyanosis or edema Skin: Skin color, texture, turgor normal. No rashes or lesions Neurologic: Grossly normal   Lab Results  Component Value Date   WBC 9.0 09/19/2015   HGB 13.6 09/19/2015   HCT 39.5 09/19/2015   MCV 87.6 09/19/2015   PLT 217 09/19/2015   Lab Results  Component Value Date   PREGTESTUR POSITIVE* 09/19/2015     Assessment/Plan Principal Problem:   Abortion, missed  For Suction D and C Risks include but are not limited  to bleeding, infection, injury to surrounding structures, including bowel, bladder and ureters, blood clots, and death.  Likelihood of success is high.    Roby Spalla S 09/20/2015, 9:21 AM

## 2015-09-21 ENCOUNTER — Encounter (HOSPITAL_COMMUNITY): Payer: Self-pay | Admitting: *Deleted

## 2015-09-21 ENCOUNTER — Encounter (HOSPITAL_COMMUNITY): Admission: RE | Payer: Self-pay | Source: Ambulatory Visit

## 2015-09-21 ENCOUNTER — Inpatient Hospital Stay (HOSPITAL_COMMUNITY): Payer: Medicaid Other | Admitting: Registered Nurse

## 2015-09-21 ENCOUNTER — Ambulatory Visit (HOSPITAL_COMMUNITY): Admission: RE | Admit: 2015-09-21 | Payer: Self-pay | Source: Ambulatory Visit | Admitting: Family Medicine

## 2015-09-21 ENCOUNTER — Encounter (HOSPITAL_COMMUNITY)
Admission: AD | Disposition: A | Discharge status: Home / Self Care | Payer: Self-pay | Source: Ambulatory Visit | Attending: Family Medicine

## 2015-09-21 ENCOUNTER — Ambulatory Visit (HOSPITAL_COMMUNITY)
Admission: AD | Admit: 2015-09-21 | Discharge: 2015-09-21 | Disposition: A | Discharge status: Home / Self Care | Payer: Medicaid Other | Source: Ambulatory Visit | Attending: Family Medicine | Admitting: Family Medicine

## 2015-09-21 DIAGNOSIS — O021 Missed abortion: Secondary | ICD-10-CM

## 2015-09-21 DIAGNOSIS — Z87891 Personal history of nicotine dependence: Secondary | ICD-10-CM | POA: Diagnosis not present

## 2015-09-21 DIAGNOSIS — Z3A12 12 weeks gestation of pregnancy: Secondary | ICD-10-CM | POA: Insufficient documentation

## 2015-09-21 HISTORY — PX: DILATION AND EVACUATION: SHX1459

## 2015-09-21 LAB — CBC
HEMATOCRIT: 36.6 % (ref 36.0–46.0)
HEMOGLOBIN: 12.6 g/dL (ref 12.0–15.0)
MCH: 30.1 pg (ref 26.0–34.0)
MCHC: 34.4 g/dL (ref 30.0–36.0)
MCV: 87.6 fL (ref 78.0–100.0)
Platelets: 199 10*3/uL (ref 150–400)
RBC: 4.18 MIL/uL (ref 3.87–5.11)
RDW: 13.3 % (ref 11.5–15.5)
WBC: 9.4 10*3/uL (ref 4.0–10.5)

## 2015-09-21 LAB — TYPE AND SCREEN
ABO/RH(D): O POS
ANTIBODY SCREEN: NEGATIVE

## 2015-09-21 SURGERY — DILATION AND EVACUATION, UTERUS
Anesthesia: Choice

## 2015-09-21 SURGERY — DILATION AND EVACUATION, UTERUS
Anesthesia: Monitor Anesthesia Care | Site: Vagina

## 2015-09-21 MED ORDER — DEXAMETHASONE SODIUM PHOSPHATE 4 MG/ML IJ SOLN
INTRAMUSCULAR | Status: AC
  Filled 2015-09-21: qty 1

## 2015-09-21 MED ORDER — LACTATED RINGERS IV SOLN: INTRAVENOUS | Status: DC

## 2015-09-21 MED ORDER — BUPIVACAINE-EPINEPHRINE 0.25% -1:200000 IJ SOLN
INTRAMUSCULAR | Status: DC | PRN
Start: 2015-09-21 — End: 2015-09-21
  Administered 2015-09-21: 20 mL

## 2015-09-21 MED ORDER — MIDAZOLAM HCL 5 MG/5ML IJ SOLN
INTRAMUSCULAR | Status: DC | PRN
  Administered 2015-09-21: 2 mg via INTRAVENOUS

## 2015-09-21 MED ORDER — KETOROLAC TROMETHAMINE 30 MG/ML IJ SOLN
INTRAMUSCULAR | Status: DC | PRN
  Administered 2015-09-21: 30 mg via INTRAVENOUS

## 2015-09-21 MED ORDER — KETOROLAC TROMETHAMINE 30 MG/ML IJ SOLN: 30.0000 mg | Freq: Once | INTRAMUSCULAR | Status: DC

## 2015-09-21 MED ORDER — SCOPOLAMINE 1 MG/3DAYS TD PT72: 1.0000 | MEDICATED_PATCH | Freq: Once | TRANSDERMAL | Status: DC

## 2015-09-21 MED ORDER — LIDOCAINE HCL (CARDIAC) 20 MG/ML IV SOLN
INTRAVENOUS | Status: DC | PRN
  Administered 2015-09-21: 50 mg via INTRAVENOUS

## 2015-09-21 MED ORDER — ONDANSETRON HCL 4 MG/2ML IJ SOLN: 4.0000 mg | Freq: Once | INTRAMUSCULAR | Status: DC | PRN

## 2015-09-21 MED ORDER — IBUPROFEN 600 MG PO TABS: 600.0000 mg | ORAL_TABLET | Freq: Four times a day (QID) | ORAL | Status: DC | PRN

## 2015-09-21 MED ORDER — LACTATED RINGERS IV BOLUS (SEPSIS)
1000.0000 mL | Freq: Once | INTRAVENOUS | Status: AC
  Administered 2015-09-21: 1000 mL via INTRAVENOUS

## 2015-09-21 MED ORDER — OXYTOCIN 10 UNIT/ML IJ SOLN
INTRAMUSCULAR | Status: AC
  Filled 2015-09-21: qty 4

## 2015-09-21 MED ORDER — MIDAZOLAM HCL 2 MG/2ML IJ SOLN
INTRAMUSCULAR | Status: AC
  Filled 2015-09-21: qty 2

## 2015-09-21 MED ORDER — LACTATED RINGERS IV SOLN
INTRAVENOUS | Status: DC
  Administered 2015-09-21: 08:00:00 via INTRAVENOUS

## 2015-09-21 MED ORDER — FENTANYL CITRATE (PF) 100 MCG/2ML IJ SOLN: 25.0000 ug | INTRAMUSCULAR | Status: DC | PRN

## 2015-09-21 MED ORDER — FENTANYL CITRATE (PF) 100 MCG/2ML IJ SOLN
INTRAMUSCULAR | Status: AC
  Filled 2015-09-21: qty 2

## 2015-09-21 MED ORDER — ACETAMINOPHEN 325 MG PO TABS: 325.0000 mg | ORAL_TABLET | ORAL | Status: DC | PRN

## 2015-09-21 MED ORDER — DOXYCYCLINE HYCLATE 100 MG IV SOLR
100.0000 mg | Freq: Once | INTRAVENOUS | Status: AC
  Administered 2015-09-21: 100 mg via INTRAVENOUS
  Filled 2015-09-21: qty 100

## 2015-09-21 MED ORDER — DEXAMETHASONE SODIUM PHOSPHATE 4 MG/ML IJ SOLN
INTRAMUSCULAR | Status: DC | PRN
  Administered 2015-09-21: 4 mg via INTRAVENOUS

## 2015-09-21 MED ORDER — PROPOFOL 10 MG/ML IV BOLUS
INTRAVENOUS | Status: AC
  Filled 2015-09-21: qty 20

## 2015-09-21 MED ORDER — ONDANSETRON HCL 4 MG/2ML IJ SOLN
INTRAMUSCULAR | Status: AC
  Filled 2015-09-21: qty 2

## 2015-09-21 MED ORDER — PROPOFOL 10 MG/ML IV BOLUS
INTRAVENOUS | Status: DC | PRN
  Administered 2015-09-21 (×6): 20 mg via INTRAVENOUS
  Administered 2015-09-21: 40 mg via INTRAVENOUS
  Administered 2015-09-21: 30 mg via INTRAVENOUS
  Administered 2015-09-21 (×3): 20 mg via INTRAVENOUS

## 2015-09-21 MED ORDER — IBUPROFEN 800 MG PO TABS
800.0000 mg | ORAL_TABLET | Freq: Once | ORAL | Status: AC
  Administered 2015-09-21: 800 mg via ORAL
  Filled 2015-09-21: qty 1

## 2015-09-21 MED ORDER — HYDROMORPHONE HCL 1 MG/ML IJ SOLN: 0.5000 mg | Freq: Once | INTRAMUSCULAR | Status: DC

## 2015-09-21 MED ORDER — ONDANSETRON HCL 4 MG/2ML IJ SOLN
INTRAMUSCULAR | Status: DC | PRN
  Administered 2015-09-21: 4 mg via INTRAVENOUS

## 2015-09-21 MED ORDER — MEPERIDINE HCL 25 MG/ML IJ SOLN: 6.2500 mg | INTRAMUSCULAR | Status: DC | PRN

## 2015-09-21 MED ORDER — ACETAMINOPHEN 160 MG/5ML PO SOLN: 325.0000 mg | ORAL | Status: DC | PRN

## 2015-09-21 MED ORDER — KETOROLAC TROMETHAMINE 30 MG/ML IJ SOLN
INTRAMUSCULAR | Status: AC
  Filled 2015-09-21: qty 1

## 2015-09-21 MED ORDER — OXYTOCIN 10 UNIT/ML IJ SOLN
INTRAMUSCULAR | Status: DC | PRN
  Administered 2015-09-21: 40 [IU] via INTRAMUSCULAR

## 2015-09-21 MED ORDER — HYDROCODONE-ACETAMINOPHEN 7.5-325 MG PO TABS: 1.0000 | ORAL_TABLET | Freq: Once | ORAL | Status: DC | PRN

## 2015-09-21 MED ORDER — FENTANYL CITRATE (PF) 100 MCG/2ML IJ SOLN
INTRAMUSCULAR | Status: DC | PRN
  Administered 2015-09-21: 100 ug via INTRAVENOUS

## 2015-09-21 MED ORDER — LIDOCAINE HCL (CARDIAC) 20 MG/ML IV SOLN
INTRAVENOUS | Status: AC
  Filled 2015-09-21: qty 5

## 2015-09-21 SURGICAL SUPPLY — 19 items
CATH ROBINSON RED A/P 16FR (CATHETERS) ×3 IMPLANT
CLOTH BEACON ORANGE TIMEOUT ST (SAFETY) ×3 IMPLANT
DECANTER SPIKE VIAL GLASS SM (MISCELLANEOUS) ×3 IMPLANT
GLOVE BIOGEL PI IND STRL 7.0 (GLOVE) ×2 IMPLANT
GLOVE BIOGEL PI INDICATOR 7.0 (GLOVE) ×4
GLOVE ECLIPSE 7.0 STRL STRAW (GLOVE) ×6 IMPLANT
GOWN STRL REUS W/TWL LRG LVL3 (GOWN DISPOSABLE) ×9 IMPLANT
KIT BERKELEY 1ST TRIMESTER 3/8 (MISCELLANEOUS) ×3 IMPLANT
NS IRRIG 1000ML POUR BTL (IV SOLUTION) ×3 IMPLANT
PACK VAGINAL MINOR WOMEN LF (CUSTOM PROCEDURE TRAY) ×3 IMPLANT
PAD OB MATERNITY 4.3X12.25 (PERSONAL CARE ITEMS) ×3 IMPLANT
PAD PREP 24X48 CUFFED NSTRL (MISCELLANEOUS) ×3 IMPLANT
SET BERKELEY SUCTION TUBING (SUCTIONS) ×3 IMPLANT
TOWEL OR 17X24 6PK STRL BLUE (TOWEL DISPOSABLE) ×6 IMPLANT
VACURETTE 10 RIGID CVD (CANNULA) IMPLANT
VACURETTE 12 RIGID CVD (CANNULA) ×2 IMPLANT
VACURETTE 7MM CVD STRL WRAP (CANNULA) IMPLANT
VACURETTE 8 RIGID CVD (CANNULA) IMPLANT
VACURETTE 9 RIGID CVD (CANNULA) IMPLANT

## 2015-09-21 NOTE — OR Nursing (Signed)
Pt waiting for her ride

## 2015-09-21 NOTE — Anesthesia Preprocedure Evaluation (Signed)
Anesthesia Evaluation  Patient identified by MRN, date of birth, ID band Patient awake    Reviewed: Allergy & Precautions, H&P , NPO status , Patient's Chart, lab work & pertinent test results  Airway Mallampati: I  TM Distance: >3 FB Neck ROM: full    Dental no notable dental hx. (+) Teeth Intact   Pulmonary former smoker,    Pulmonary exam normal        Cardiovascular negative cardio ROS Normal cardiovascular exam     Neuro/Psych negative psych ROS   GI/Hepatic negative GI ROS, Neg liver ROS,   Endo/Other  negative endocrine ROS  Renal/GU negative Renal ROS     Musculoskeletal   Abdominal Normal abdominal exam  (+)   Peds  Hematology negative hematology ROS (+)   Anesthesia Other Findings   Reproductive/Obstetrics negative OB ROS                             Anesthesia Physical Anesthesia Plan  ASA: I  Anesthesia Plan: MAC   Post-op Pain Management:    Induction:   Airway Management Planned:   Additional Equipment:   Intra-op Plan:   Post-operative Plan:   Informed Consent: I have reviewed the patients History and Physical, chart, labs and discussed the procedure including the risks, benefits and alternatives for the proposed anesthesia with the patient or authorized representative who has indicated his/her understanding and acceptance.     Plan Discussed with: CRNA and Surgeon  Anesthesia Plan Comments:         Anesthesia Quick Evaluation

## 2015-09-21 NOTE — H&P (View-Only) (Signed)
  Amy [name] is an 34 y.o. [id number] female.   Chief Complaint: missed AB HPI: 12 wk 1 day missed AB needing definitive treatment.  Past Medical History  Diagnosis Date  . Migraine     Past Surgical History  Procedure Laterality Date  . Cesarean section      Family History  Problem Relation Age of Onset  . Cancer Father    Social History:  reports that she has quit smoking. She uses smokeless tobacco. She reports that she does not drink alcohol. Her drug history is not on file.  Allergies: No Known Allergies  No current facility-administered medications on file prior to encounter.   Current Outpatient Prescriptions on File Prior to Encounter  Medication Sig Dispense Refill  . acetaminophen (TYLENOL) 500 MG tablet Take 1,000 mg by mouth every 6 (six) hours as needed for mild pain or headache.    . Prenatal Vit-Fe Fumarate-FA (PRENATAL MULTIVITAMIN) TABS tablet Take 1 tablet by mouth daily.    . sodium chloride (OCEAN) 0.65 % SOLN nasal spray Place 1-2 sprays into both nostrils as needed for congestion.      A comprehensive review of systems was negative.  Last menstrual period 06/02/2015. General appearance: alert, cooperative and appears stated age Head: Normocephalic, without obvious abnormality, atraumatic Neck: supple, symmetrical, trachea midline Lungs: normal effort Heart: regular rate and rhythm Abdomen: soft, non-tender; bowel sounds normal; no masses,  no organomegaly Extremities: extremities normal, atraumatic, no cyanosis or edema Skin: Skin color, texture, turgor normal. No rashes or lesions Neurologic: Grossly normal   Lab Results  Component Value Date   WBC 9.0 09/19/2015   HGB 13.6 09/19/2015   HCT 39.5 09/19/2015   MCV 87.6 09/19/2015   PLT 217 09/19/2015   Lab Results  Component Value Date   PREGTESTUR POSITIVE* 09/19/2015     Assessment/Plan Principal Problem:   Abortion, missed  For Suction D and C Risks include but are not limited  to bleeding, infection, injury to surrounding structures, including bowel, bladder and ureters, blood clots, and death.  Likelihood of success is high.    [name] S 09/20/2015, 9:21 AM     

## 2015-09-21 NOTE — Anesthesia Postprocedure Evaluation (Signed)
Anesthesia Post Note  Patient: Amy AdamJessica M [name]  Procedure(s) Performed: Procedure(s) (LRB): DILATATION AND EVACUATION (N/A)  Patient location during evaluation: PACU Anesthesia Type: MAC Level of consciousness: awake Pain management: pain level controlled Vital Signs Assessment: post-procedure vital signs reviewed and stable Respiratory status: spontaneous breathing Cardiovascular status: stable Postop Assessment: no signs of nausea or vomiting Anesthetic complications: no    Last Vitals:  Filed Vitals:   09/21/15 0930 09/21/15 0945  BP: 100/64 96/62  Pulse: 66 67  Temp:    Resp: 13 13    Last Pain:  Filed Vitals:   09/21/15 1000  PainSc: 7                  Huck Ashworth JR,JOHN Ginnie Marich

## 2015-09-21 NOTE — Progress Notes (Signed)
CBC ordered 

## 2015-09-21 NOTE — MAU Provider Note (Signed)
History     CSN: [id number]  Arrival date and time: 09/21/15 [id number]   First Provider Initiated Contact with Patient 09/21/15 0408      Chief Complaint  Patient presents with  . Vaginal Bleeding   HPI [name] is a 35 y.o. [id number] at 4083w6d who presents to MAU today with complaint of vaginal bleeding. The patient states that she was here on Sunday for light bleeding and diagnosed with missed AB at 2242w1d. [facility]. She states that bleeding slowed after discharge from MAU. She woke up suddenly with pain around 0230 this morning and went to the bathroom to noted heavy bright red bleeding with multiple moderate to large clots. She states bleeding has continued to be heavy with clots. She states moderate intermittent lower abdominal pain. She denies fever or use of pain medication. She states she last ate just before midnight.   OB History    Gravida Para Term Preterm AB TAB SAB Ectopic Multiple Living   7 2 2  4 3 1   2       Past Medical History  Diagnosis Date  . Migraine     Past Surgical History  Procedure Laterality Date  . Cesarean section    . Therapeutic abortion      Family History  Problem Relation Age of Onset  . Cancer Father     Social History  Substance Use Topics  . Smoking status: Former Smoker -- 0.50 packs/day  . Smokeless tobacco: Current User  . Alcohol Use: No    Allergies: No Known Allergies  Prescriptions prior to admission  Medication Sig Dispense Refill Last Dose  . acetaminophen (TYLENOL) 500 MG tablet Take 1,000 mg by mouth every 6 (six) hours as needed for mild pain or headache.   Past Week at Unknown time  . Prenatal Vit-Fe Fumarate-FA (PRENATAL MULTIVITAMIN) TABS tablet Take 1 tablet by mouth daily.   09/20/2015 at Unknown time  . sodium chloride (OCEAN) 0.65 % SOLN nasal spray Place 1-2 sprays into both nostrils as needed for congestion.   09/18/2015 at Unknown time    Review of Systems   Constitutional: Negative for fever and malaise/fatigue.  Gastrointestinal: Positive for abdominal pain. Negative for nausea, vomiting, diarrhea and constipation.  Genitourinary:       + vaginal bleeding   Physical Exam   Blood pressure 112/61, pulse 86, resp. rate 16, height 5\' 7"  (1.702 m), weight 198 lb (89.812 kg), last menstrual period 06/02/2015.  Physical Exam  Nursing note and vitals reviewed. Constitutional: She is oriented to person, place, and time. She appears well-developed and well-nourished. No distress.  HENT:  Head: Normocephalic and atraumatic.  Cardiovascular: Normal rate.   Respiratory: Effort normal.  GI: Soft. She exhibits no distension and no mass. There is tenderness (mild suprapubic tenderness to palpation). There is no rebound and no guarding.  Genitourinary: Cervix exhibits no motion tenderness, no discharge and no friability. There is bleeding (moderate blood pooling in the vaginal vault with multiple moderate sized clots) in the vagina.  Neurological: She is alert and oriented to person, place, and time.  Skin: Skin is warm and dry. No erythema.  Psychiatric: She has a normal mood and affect.  Dilation: 1.5 Effacement (%): Thick Cervical Position: Middle Exam by:: Magnus SinningWenzel, PA-C   Results for orders placed or performed during the hospital encounter of 09/21/15 (from the past 24 hour(s))  CBC     Status: None  Collection Time: 09/21/15  4:10 AM  Result Value Ref Range   WBC 9.4 4.0 - 10.5 K/uL   RBC 4.18 3.87 - 5.11 MIL/uL   Hemoglobin 12.6 12.0 - 15.0 g/dL   HCT 09.8 11.9 - 14.7 %   MCV 87.6 78.0 - 100.0 fL   MCH 30.1 26.0 - 34.0 pg   MCHC 34.4 30.0 - 36.0 g/dL   RDW 82.9 56.2 - 13.0 %   Platelets 199 150 - 400 K/uL    MAU Course  Procedures None  MDM CBC and type and screen ordered IV LR bolus started Discussed patient with Dr. Adrian Blackwater. He will come to MAU to evaluate the patient.   Assessment and Plan  A: Missed AB at [redacted]w[redacted]d Vaginal  bleeding  P:    Marny Lowenstein, PA-C  09/21/2015, 4:39 AM   Patient assessed - bleeding improved.  No dizziness and lightheadedness.  Ate fried food just before midnight.  Hemodynamically stable:  BP 112/61 mmHg  Pulse 86  Resp 16  Ht  (1.702 m)  Wt 198 lb (89.812 kg)  BMI 31.00 kg/m2  LMP 06/02/2015  Gen: Pt comfortable.  Appears stated age.  No cardiopulmonary distress. HR: RR, no murmurs.  Periferal pulses 2+.  Good capillary refill. Lungs: normal effort Abd:  Gravid uterus to about 12 weeks.  Clot at introitus.  Scant blood on pad, which was changed about 1 hr ago.   Ext: no edema.  No focal neurological deficits.    A/P: 1.  Missed Ab 2.  Vaginal Bleeding  Patient scheduled for D&E later today.  Will hold in MAU.  Continue NPO until 8hrs NPO, then plan on surgery as patient hemodynamically stable.  Will proceed earlier if worsens.  Levie Heritage, DO 09/21/2015 6:43 AM

## 2015-09-21 NOTE — Op Note (Signed)
Amy AdamJessica M Moret  PROCEDURE DATE: 09/21/2015  PREOPERATIVE DIAGNOSIS: 12 week missed abortion.  POSTOPERATIVE DIAGNOSIS: The same.  PROCEDURE:    Suction Dilation and Evacuation.  SURGEON:  Claborn Janusz S  ANESTHESIA: Leilani AbleFranklin Hatchett, MD  INDICATIONS: 35 y.o. [id number] MAB at 12 1/[redacted] weeks gestation, needing surgical completion.  Risks of surgery were discussed with the patient including but not limited to: bleeding which may require transfusion; infection which may require antibiotics; injury to uterus or surrounding organs;need for additional procedures including laparotomy or laparoscopy; possibility of intrauterine scarring which may impair future fertility; and other postoperative/anesthesia complications. Written informed consent was obtained.    FINDINGS:  A 14 wk size midline uterus, moderate amounts of products of conception, specimen sent to pathology. Large amount of clot and debris noted in vagina. Cervix widely patent with placenta and membranes noted  ANESTHESIA:    Monitored intravenous sedation, paracervical block.  ESTIMATED BLOOD LOSS:  Less than 20 ml.  SPECIMENS:  Products of conception sent to pathology  COMPLICATIONS:  None immediate.  PROCEDURE DETAILS:  The patient received intravenous antibiotics while in the preoperative area.  She was then taken to the operating room where general anesthesia was administered and was found to be adequate.  After an adequate timeout was performed, she was placed in the dorsal lithotomy position and examined; then prepped and draped in the sterile manner.   Her bladder was catheterized for an unmeasured amount of clear, yellow urine. A vaginal speculum was then placed in the patient's vagina and a placenta and tissue teased out from cervix. A small ring forcep was applied to the anterior lip of the cervix.  A paracervical block using 0.25% Marcaine with Epinephrine was administered. The uterus sounded to 11 cm. The cervix was already  dilated to accommodate a 12 mm suction curette that was gently advanced to the uterine fundus.  The suction device was then activated and curette slowly rotated to clear the uterus of products of conception.  A sharp curettage was then performed to confirm complete emptying of the uterus.There was minimal bleeding noted and the tenaculum removed with good hemostasis noted.  All insturmnent, needle and lap counts were correct x 2.The patient tolerated the procedure well.  The patient was taken to the recovery area in stable condition.  [name] 09/21/2015 8:58 AM

## 2015-09-21 NOTE — Discharge Instructions (Signed)
Dilation and Curettage or Vacuum Curettage, Care After °Refer to this sheet in the next few weeks. These instructions provide you with information on caring for yourself after your procedure. Your health care provider may also give you more specific instructions. Your treatment has been planned according to current medical practices, but problems sometimes occur. Call your health care provider if you have any problems or questions after your procedure. °WHAT TO EXPECT AFTER THE PROCEDURE °After your procedure, it is typical to have light cramping and bleeding. This may last for 2 days to 2 weeks after the procedure. °HOME CARE INSTRUCTIONS  °· Do not drive for 24 hours. °· Wait 1 week before returning to strenuous activities. °· Take your temperature 2 times a day for 4 days and write it down. Provide these temperatures to your health care provider if you develop a fever. °· Avoid long periods of standing. °· Avoid heavy lifting, pushing, or pulling. Do not lift anything heavier than 10 pounds (4.5 kg). °· Limit stair climbing to once or twice a day. °· Take rest periods often. °· You may resume your usual diet. °· Drink enough fluids to keep your urine clear or pale yellow. °· Your usual bowel function should return. If you have constipation, you may: °¨ Take a mild laxative with permission from your health care provider. °¨ Add fruit and bran to your diet. °¨ Drink more fluids. °· Take showers instead of baths until your health care provider gives you permission to take baths. °· Do not go swimming or use a hot tub until your health care provider approves. °· Try to have someone with you or available to you the first 24-48 hours, especially if you were given a general anesthetic. °· Do not douche, use tampons, or have sex (intercourse) for 2 weeks after the procedure. °· Only take over-the-counter or prescription medicines as directed by your health care provider. Do not take aspirin. It can cause  bleeding. °· Follow up with your health care provider as directed. °SEEK MEDICAL CARE IF:  °· You have increasing cramps or pain that is not relieved with medicine. °· You have abdominal pain that does not seem to be related to the same area of earlier cramping and pain. °· You have bad smelling vaginal discharge. °· You have a rash. °· You are having problems with any medicine. °SEEK IMMEDIATE MEDICAL CARE IF:  °· You have bleeding that is heavier than a normal menstrual period. °· You have a fever. °· You have chest pain. °· You have shortness of breath. °· You feel dizzy or feel like fainting. °· You pass out. °· You have pain in your shoulder strap area. °· You have heavy vaginal bleeding with or without blood clots. °MAKE SURE YOU:  °· Understand these instructions. °· Will watch your condition. °· Will get help right away if you are not doing well or get worse. °  °This information is not intended to replace advice given to you by your health care provider. Make sure you discuss any questions you have with your health care provider. °  °Document Released: 06/09/2000 Document Revised: 06/17/2013 Document Reviewed: 01/09/2013 °Elsevier Interactive Patient Education ©2016 [employer] °DISCHARGE INSTRUCTIONS: D&C / D&E °The following instructions have been prepared to help you care for yourself upon your return home. °  °Personal hygiene: °• Use sanitary pads for vaginal drainage, not tampons. °• Shower the day after your procedure. °• NO tub baths, pools or Jacuzzis for 2-3 weeks. °•   Wipe front to back after using the bathroom. ° °Activity and limitations: °• Do NOT drive or operate any equipment for 24 hours. The effects of anesthesia are still present and drowsiness may result. °• Do NOT rest in bed all day. °• Walking is encouraged. °• Walk up and down stairs slowly. °• You may resume your normal activity in one to two days or as indicated by your physician. ° °Sexual activity: NO intercourse for at least 2  weeks after the procedure, or as indicated by your physician. ° °Diet: Eat a light meal as desired this evening. You may resume your usual diet tomorrow. ° °Return to work: You may resume your work activities in one to two days or as indicated by your doctor. ° °What to expect after your surgery: Expect to have vaginal bleeding/discharge for 2-3 days and spotting for up to 10 days. It is not unusual to have soreness for up to 1-2 weeks. You may have a slight burning sensation when you urinate for the first day. Mild cramps may continue for a couple of days. You may have a regular period in 2-6 weeks. ° °Call your doctor for any of the following: °• Excessive vaginal bleeding, saturating and changing one pad every hour. °• Inability to urinate 6 hours after discharge from hospital. °• Pain not relieved by pain medication. °• Fever of 100.4° F or greater. °• Unusual vaginal discharge or odor. ° ° Call for an appointment:  ° ° °Patient’s signature: ______________________ ° °Nurse’s signature ________________________ ° °Support person's signature_______________________ ° ° ° °

## 2015-09-21 NOTE — Interval H&P Note (Signed)
History and Physical Interval Note:  09/21/2015 8:12 AM  Amy AdamJessica M [name]  has presented today for surgery, with the diagnosis of Missed Ab  The various methods of treatment have been discussed with the patient and family. After consideration of risks, benefits and other options for treatment, the patient has consented to  Procedure(s): DILATATION AND EVACUATION (N/A) as a surgical intervention .  The patient's history has been reviewed, patient examined, no change in status, stable for surgery.  I have reviewed the patient's chart and labs.  Questions were answered to the patient's satisfaction.     [name]

## 2015-09-21 NOTE — Transfer of Care (Signed)
Immediate Anesthesia Transfer of Care Note  Patient: Amy [name]  Procedure(s) Performed: Procedure(s): DILATATION AND EVACUATION (N/A)  Patient Location: PACU  Anesthesia Type:MAC  Level of Consciousness: sedated  Airway & Oxygen Therapy: Patient Spontanous Breathing and Patient connected to nasal cannula oxygen  Post-op Assessment: Report given to RN  Post vital signs: Reviewed  Last Vitals:  Filed Vitals:   09/21/15 0316 09/21/15 0759  BP: 112/61 91/69  Pulse: 86 85  Temp:  37.1 C  Resp: 16 18    Complications: No apparent anesthesia complications

## 2015-09-21 NOTE — MAU Note (Signed)
Pt started with intermittent cramping at midnight tonight. Passing clots 3cm size, .bleeding started @ 0200. Reports saturating a pad in 1-2 hrs.  Was scheduled for a D & E at noon today.

## 2015-09-21 NOTE — Progress Notes (Signed)
Shanda BumpsJessica is coping with her miscarriage as well as can be expected.  She reports that she is sad and angry, but she knew that this was a possibility and had experienced a miscarriage previously.  She has a 35 year old son and a 35 year-old daughter who both were anticipating having a baby in the home.  She said they are coping well at this time.  We talked about the differences in the way that children may process grief and I gave her information for [facility].  She also spoke about blaming herself or her SO for causing her stress.  I asked what kinds of stress and she did not name anything of particular concern.  We spoke about the tendency of women to blame themselves and how it is not necessary.  She needed to hear that this was not her fault.  I gave her grief resources along with my card for follow-up.  Chaplain Dyanne CarrelKaty Muriah Harsha,, Bcc Pager, [phone] 11:02 AM    09/21/15 1000  Clinical Encounter Type  Visited With Patient  Visit Type Spiritual support  Spiritual Encounters  Spiritual Needs Grief support;Emotional  Stress Factors  Patient Stress Factors Loss (Miscarriage)

## 2015-09-22 ENCOUNTER — Encounter (HOSPITAL_COMMUNITY): Payer: Self-pay | Admitting: Family Medicine

## 2015-09-22 LAB — HERPES SIMPLEX VIRUS CULTURE
Culture: DETECTED
Special Requests: NORMAL

## 2015-09-23 ENCOUNTER — Telehealth: Payer: Self-pay | Admitting: Obstetrics and Gynecology

## 2015-09-23 MED ORDER — ACYCLOVIR 400 MG PO TABS: 400.0000 mg | ORAL_TABLET | Freq: Three times a day (TID) | ORAL | Status: DC

## 2015-09-23 NOTE — Telephone Encounter (Signed)
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 Patient aware.

## 2015-10-07 ENCOUNTER — Encounter: Payer: Self-pay | Admitting: Family Medicine

## 2015-10-07 ENCOUNTER — Ambulatory Visit (INDEPENDENT_AMBULATORY_CARE_PROVIDER_SITE_OTHER): Payer: Self-pay | Admitting: Family Medicine

## 2015-10-07 ENCOUNTER — Ambulatory Visit: Payer: Medicaid Other | Admitting: Obstetrics & Gynecology

## 2015-10-07 VITALS — BP 116/61 | HR 82 | Temp 97.9°F | Wt 195.9 lb

## 2015-10-07 DIAGNOSIS — Z09 Encounter for follow-up examination after completed treatment for conditions other than malignant neoplasm: Secondary | ICD-10-CM

## 2015-10-07 DIAGNOSIS — B009 Herpesviral infection, unspecified: Secondary | ICD-10-CM | POA: Insufficient documentation

## 2015-10-07 NOTE — Progress Notes (Signed)
    Subjective:    Patient ID: Amy [name] is a 35 y.o. female presenting with Follow-up  on 10/07/2015  HPI: Here following D & C for incomplete at 12 wks. Still spotting on occasion. Doing well.  Also noted to have HSV2 positive culture at that time. Has not picked up meds.   Review of Systems  Constitutional: Negative for fever and chills.  Respiratory: Negative for shortness of breath.   Cardiovascular: Negative for chest pain.  Gastrointestinal: Negative for nausea, vomiting and abdominal pain.  Genitourinary: Negative for dysuria.  Skin: Negative for rash.      Objective:    BP 116/61 mmHg  Pulse 82  Temp(Src) 97.9 F (36.6 C) (Oral)  Wt 195 lb 14.4 oz (88.86 kg)  LMP 06/02/2015  Breastfeeding? Unknown Physical Exam  Constitutional: She is oriented to person, place, and time. She appears well-developed and well-nourished. No distress.  HENT:  Head: Normocephalic and atraumatic.  Eyes: No scleral icterus.  Neck: Neck supple.  Cardiovascular: Normal rate.   Pulmonary/Chest: Effort normal.  Abdominal: Soft.  Neurological: She is alert and oriented to person, place, and time.  Skin: Skin is warm and dry.  Psychiatric: She has a normal mood and affect.        Assessment & Plan:   Problem List Items Addressed This Visit      Unprioritized   HSV-2 (herpes simplex virus 2) infection    Discussed ramifications. Consider prophylaxis--she will consider.       Other Visit Diagnoses    Postop check    -  Primary       Return if symptoms worsen or fail to improve.  Chin Wachter S 10/07/2015 1:53 PM

## 2015-10-07 NOTE — Patient Instructions (Addendum)
Preparing for Pregnancy Before trying to become pregnant, make an appointment with your health care provider (preconception care). The goal is to help you have a healthy, safe pregnancy. At your first appointment, your health care provider will:   Do a complete physical exam, including a Pap test.  Take a complete medical history.  Give you advice and help you resolve any problems. PRECONCEPTION CHECKLIST Here is a list of the basics to cover with your health care provider at your preconception visit:  Medical history.  Tell your health care provider about any diseases you have had. Many diseases can affect your pregnancy.  Include your partner's medical history and family history.  Make sure you have been tested for sexually transmitted infections (STIs). These can affect your pregnancy. In some cases, they can be passed to your baby. Tell your health care provider about any history of STIs.  Make sure your health care provider knows about any previous problems you have had with conception or pregnancy.  Tell your health care provider about any medicine you take. This includes herbal supplements and over-the-counter medicines.  Make sure all your immunizations are up to date. You may need to make additional appointments.  Ask your health care provider if you need any vaccinations or if there are any you should avoid.  Diet.  It is especially important to eat a healthy, balanced diet with the right nutrients when you are pregnant.  Ask your health care provider to help you get to a healthy weight before pregnancy.  If you are overweight, you are at higher risk for certain complications. These include high blood pressure, diabetes, and preterm birth.  If you are underweight, you are more likely to have a low-birth-weight baby.  Lifestyle.  Tell your health care provider about lifestyle factors such as alcohol use, drug use, or smoking.  Describe any harmful substances you may  be exposed to at work or home. These can include chemicals, pesticides, and radiation.  Mental health.  Let your health care provider know if you have been feeling depressed or anxious.  Let your health care provider know if you have a history of substance abuse.  Let your health care provider know if you do not feel safe at home. HOME INSTRUCTIONS TO PREPARE FOR PREGNANCY Follow your health care provider's advice and instructions.   Keep an accurate record of your menstrual periods. This makes it easier for your health care provider to determine your baby's due date.  Begin taking prenatal vitamins and folic acid supplements daily. Take them as directed by your health care provider.  Eat a balanced diet. Get help from a nutrition counselor if you have questions or need help.  Get regular exercise. Try to be active for at least 30 minutes a day most days of the week.  Quit smoking, if you smoke.  Do not drink alcohol.  Do not take illegal drugs.  Get medical problems, such as diabetes or high blood pressure, under control.  If you have diabetes, make sure you do the following:  Have good blood sugar control. If you have type 1 diabetes, use multiple daily doses of insulin. Do not use split-dose or premixed insulin.  Have an eye exam by a qualified eye care professional trained in caring for people with diabetes.  Get evaluated by your health care provider for cardiovascular disease.  Get to a healthy weight. If you are overweight or obese, reduce your weight with the help of a qualified health   professional such as a Museum/gallery exhibitions officerregistered dietitian. Ask your health care provider what the right weight range is for you. HOW DO I KNOW I AM PREGNANT? You may be pregnant if you have been sexually active and you miss your period. Symptoms of early pregnancy include:   Mild cramping.  Very light vaginal bleeding (spotting).  Feeling unusually tired.  Morning sickness. If you have any of  these symptoms, take a home pregnancy test. These tests look for a hormone called human chorionic gonadotropin (hCG) in your urine. Your body begins to make this hormone during early pregnancy. These tests are very accurate. Wait until at least the first day you miss your period to take one. If you get a positive result, call your health care provider to make appointments for prenatal care. WHAT SHOULD I DO IF I BECOME PREGNANT?  Make an appointment with your health care provider by week 12 of your pregnancy at the latest.  Do not smoke. Smoking can be harmful to your baby.  Do not drink alcoholic beverages. Alcohol is related to a number of birth defects.  Avoid toxic odors and chemicals.  You may continue to have sexual intercourse if it does not cause pain or other problems, such as vaginal bleeding.   This information is not intended to replace advice given to you by your health care provider. Make sure you discuss any questions you have with your health care provider.   Document Released: 05/25/2008 Document Revised: 07/03/2014 Document Reviewed: 05/19/2013 Elsevier Interactive Patient Education 2016 Else[employer] Inc[employer] Genital Herpes Genital herpes is a common sexually transmitted infection (STI) that is caused by a virus. The virus is spread from person to person through sexual contact. Infection can cause itching, blisters, and sores in the genital area or rectal area. This is called an outbreak. It affects both men and women. Genital herpes is particularly concerning for pregnant women because the virus can be passed to the baby during delivery and cause serious problems. Genital herpes is also a concern for people with a weakened defense (immune) system. Symptoms of genital herpes may last several days and then go away. However, the virus remains in your body, so you may have more outbreaks of symptoms in the future. The time between outbreaks varies and can be months or  years. CAUSES Genital herpes is caused by a virus called herpes simplex virus (HSV) type 2 or HSV type 1. These viruses are contagious and are most often spread through sexual contact with an infected person. Sexual contact includes vaginal, anal, and oral sex. RISK FACTORS Risk factors for genital herpes include:  Being sexually active with multiple partners.  Having unprotected sex. SIGNS AND SYMPTOMS Symptoms may include:  Pain and itching in the genital area or rectal area.  Small red bumps that turn into blisters and then turn into sores.  Flu-like symptoms, including:  Fever.  Body aches.  Painful urination.  Vaginal discharge. DIAGNOSIS Genital herpes may be diagnosed by:  Physical exam.  Blood test.  Fluid culture test from an open sore. TREATMENT There is no cure for genital herpes. Oral antiviral medicines may be used to speed up healing and to help prevent the return of symptoms. These medicines can also help to reduce the spread of the virus to sexual partners. HOME CARE INSTRUCTIONS  Keep the affected areas dry and clean.  Take medicines only as directed by your health care provider.  Do not have sexual contact during active infections. Genital herpes is contagious.  Practice safe sex. Latex condoms and female condoms may help to prevent the spread of the herpes virus.  Avoid rubbing or touching the blisters and sores. If you do touch the blister or sores:  Wash your hands thoroughly.  Do not touch your eyes afterward.  If you become pregnant, tell your health care provider if you have had genital herpes.  Keep all follow-up visits as directed by your health care provider. This is important. PREVENTION  Use condoms. Although anyone can contract genital herpes during sexual contact even with the use of a condom, a condom can provide some protection.  Avoid having multiple sexual partners.  Talk to your sexual partner about any symptoms and past  history that either of you may have.  Get tested before you have sex. Ask your partner to do the same.  Recognize the symptoms of genital herpes. Do not have sexual contact if you notice these symptoms. SEEK MEDICAL CARE IF:  Your symptoms are not improving with medicine.  Your symptoms return.  You have new symptoms.  You have a fever.  You have abdominal pain.  You have redness, swelling, or pain in your eye. MAKE SURE YOU:  Understand these instructions.  Will watch your condition.  Will get help right away if you are not doing well or get worse.   This information is not intended to replace advice given to you by your health care provider. Make sure you discuss any questions you have with your health care provider.   Document Released: 06/09/2000 Document Revised: 07/03/2014 Document Reviewed: 10/28/2013 Elsevier Interactive Patient Education [employer]

## 2015-10-07 NOTE — Assessment & Plan Note (Signed)
Discussed ramifications. Consider prophylaxis--she will consider.

## 2016-05-22 ENCOUNTER — Encounter (HOSPITAL_COMMUNITY): Payer: Self-pay | Admitting: Emergency Medicine

## 2016-05-22 ENCOUNTER — Emergency Department (HOSPITAL_COMMUNITY)
Admission: EM | Admit: 2016-05-22 | Discharge: 2016-05-22 | Disposition: A | Discharge status: Home / Self Care | Payer: Medicaid Other | Attending: Emergency Medicine | Admitting: Emergency Medicine

## 2016-05-22 DIAGNOSIS — Z87891 Personal history of nicotine dependence: Secondary | ICD-10-CM | POA: Diagnosis not present

## 2016-05-22 DIAGNOSIS — J0100 Acute maxillary sinusitis, unspecified: Secondary | ICD-10-CM | POA: Diagnosis not present

## 2016-05-22 DIAGNOSIS — Z79899 Other long term (current) drug therapy: Secondary | ICD-10-CM | POA: Insufficient documentation

## 2016-05-22 DIAGNOSIS — R0981 Nasal congestion: Secondary | ICD-10-CM | POA: Diagnosis present

## 2016-05-22 MED ORDER — AMOXICILLIN-POT CLAVULANATE 875-125 MG PO TABS: 1.0000 | ORAL_TABLET | Freq: Two times a day (BID) | ORAL | 0 refills | Status: DC

## 2016-05-22 NOTE — ED Triage Notes (Signed)
Pt c/o congestion, headache, and dry cough "since furniture market" in October. Pt sts she feels like she needs antibiotics to kick whatever it is she has. Pt sts she has had some yellow green mucus when blowing her nose. Pt denies chest pain, dizziness, SOB. A&Ox4 and ambulatory.

## 2016-05-22 NOTE — ED Notes (Signed)
PT DISCHARGED. INSTRUCTIONS AND PRESCRIPTION GIVEN. AAOX4. PT IN NO APPARENT DISTRESS OR PAIN. THE OPPORTUNITY TO ASK QUESTIONS WAS PROVIDED. 

## 2016-05-22 NOTE — Discharge Instructions (Signed)
Continue over the counter therapies Take antibiotic with food twice daily Try Zyrtec-D, Allegra-D, Clairitin-D (any one of these). Take during the day because it will increase energy levels Drink plenty of fluid

## 2016-05-22 NOTE — ED Provider Notes (Signed)
[facility]-EMERGENCY DEPT Provider Note   CSN: [id number] Arrival date & time: 05/22/16  0854     History   Chief Complaint Chief Complaint  Patient presents with  . Nasal Congestion  . Headache  . Cough    HPI Amy [name] is a 35 y.o. female who presents with nasal congestion. PMH significant for migraine headaches. She states that ever since October she has had intermittent "colds". This last one has been for the past week. She states she has had nasal congestion and sinus pressure on the right side of her face which radiates to her R ear, a headache on the right side, and a dry cough. The symptoms wax and wane depending on the time of day and if she takes OTC medicines. She has been taking Nyquil at night which helps her sleep and Ibuprofen and using nasal saline during the day. She reports increased stress at home. She was demoted and states she is living with a recovering addict and has been the sole supporter of her family for the past 6 months. She denies fever, chills, sore throat, SOB, abdominal pain, N/V.  HPI  Past Medical History:  Diagnosis Date  . Migraine     Patient Active Problem List   Diagnosis Date Noted  . 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 CONSTIPATION 12/16/2008  . OBESITY 10/12/2008  . MIGRAINE, COMMON 09/03/2008    Past Surgical History:  Procedure Laterality Date  . CESAREAN SECTION    . DILATION AND EVACUATION N/A 09/21/2015   Procedure: DILATATION AND EVACUATION;  Surgeon: Reva Boresanya S Pratt, MD;  Location: [facility]S;  Service: Gynecology;  Laterality: N/A;  . THERAPEUTIC ABORTION      OB History    Gravida Para Term Preterm AB Living   7 2 2   4 2    SAB TAB Ectopic Multiple Live Births   1 3             Home Medications    Prior to Admission medications   Medication Sig Start Date End Date Taking? Authorizing Provider  acetaminophen (TYLENOL) 500 MG tablet Take 1,000 mg by mouth every 6 (six) hours as needed for mild pain  or headache. Reported on 10/07/2015    Historical Provider, MD  amoxicillin-clavulanate (AUGMENTIN) 875-125 MG tablet Take 1 tablet by mouth 2 (two) times daily. 05/22/16   Bethel BornKelly Marie Zilpha Mcandrew, PA-C  Prenatal Vit-Fe Fumarate-FA (PRENATAL MULTIVITAMIN) TABS tablet Take 1 tablet by mouth daily. Reported on 10/07/2015    Historical Provider, MD  sodium chloride (OCEAN) 0.65 % SOLN nasal spray Place 1-2 sprays into both nostrils as needed for congestion. Reported on 10/07/2015    Historical Provider, MD    Family History Family History  Problem Relation Age of Onset  . Cancer Father     Social History Social History  Substance Use Topics  . Smoking status: Former Smoker    Packs/day: 0.50  . Smokeless tobacco: Current User  . Alcohol use No     Allergies   Patient has no known allergies.   Review of Systems Review of Systems  Constitutional: Negative for chills and fever.  HENT: Positive for congestion, ear pain, sinus pain and sinus pressure. Negative for facial swelling, rhinorrhea, sore throat and trouble swallowing.      Physical Exam Updated Vital Signs BP 133/78   Pulse 91   Temp 97.8 F (36.6 C) (Oral)   Resp 18   Ht 5\' 8"  (1.727 m)  Wt 88.5 kg   LMP 05/01/2016   SpO2 98%   BMI 29.65 kg/m   Physical Exam  Constitutional: She is oriented to person, place, and time. She appears well-developed and well-nourished. No distress.  HENT:  Head: Normocephalic and atraumatic.  Right Ear: Hearing, tympanic membrane, external ear and ear canal normal.  Left Ear: Hearing, tympanic membrane, external ear and ear canal normal.  Nose: Right sinus exhibits maxillary sinus tenderness and frontal sinus tenderness. Left sinus exhibits frontal sinus tenderness. Left sinus exhibits no maxillary sinus tenderness.  Mouth/Throat: Uvula is midline, oropharynx is clear and moist and mucous membranes are normal.  Eyes: Conjunctivae are normal. Pupils are equal, round, and reactive to light.  Right eye exhibits no discharge. Left eye exhibits no discharge. No scleral icterus.  Neck: Normal range of motion.  Cardiovascular: Normal rate and regular rhythm.  Exam reveals no gallop and no friction rub.   No murmur heard. Pulmonary/Chest: Effort normal and breath sounds normal. No respiratory distress. She has no wheezes. She has no rales. She exhibits no tenderness.  Abdominal: She exhibits no distension.  Neurological: She is alert and oriented to person, place, and time.  Skin: Skin is warm and dry.  Psychiatric: Her behavior is normal. She exhibits a depressed mood.  Nursing note and vitals reviewed.    ED Treatments / Results  Labs (all labs ordered are listed, but only abnormal results are displayed) Labs Reviewed - No data to display  EKG  EKG Interpretation None       Radiology No results found.  Procedures Procedures (including critical care time)  Medications Ordered in ED Medications - No data to display   Initial Impression / Assessment and Plan / ED Course  I have reviewed the triage vital signs and the nursing notes.  Pertinent labs & imaging results that were available during my care of the patient were reviewed by me and considered in my medical decision making (see chart for details).  Clinical Course    35 year old female with symptoms and exam consistent with sinusitis. Patient is afebrile, not tachycardic or tachypneic, normotensive, and not hypoxic. Rx for Augmentin given. Advised decongestants OTC and establishing care with PCP. Patient is NAD, non-toxic, with stable VS. Patient is informed of clinical course, understands medical decision making process, and agrees with plan. Opportunity for questions provided and all questions answered. Return precautions given.   Final Clinical Impressions(s) / ED Diagnoses   Final diagnoses:  Subacute maxillary sinusitis    New Prescriptions New Prescriptions   AMOXICILLIN-CLAVULANATE (AUGMENTIN)  875-125 MG TABLET    Take 1 tablet by mouth 2 (two) times daily.     Bethel BornKelly Marie Arriah Wadle, PA-C 05/22/16 1520    Maia PlanJoshua G Long, MD 05/23/16 [phone]

## 2018-06-19 ENCOUNTER — Emergency Department (HOSPITAL_COMMUNITY)
Admission: EM | Admit: 2018-06-19 | Discharge: 2018-06-19 | Disposition: A | Discharge status: Home / Self Care | Payer: Self-pay | Attending: Emergency Medicine | Admitting: Emergency Medicine

## 2018-06-19 ENCOUNTER — Encounter (HOSPITAL_COMMUNITY): Payer: Self-pay

## 2018-06-19 ENCOUNTER — Other Ambulatory Visit: Payer: Self-pay

## 2018-06-19 DIAGNOSIS — S51811A Laceration without foreign body of right forearm, initial encounter: Secondary | ICD-10-CM | POA: Insufficient documentation

## 2018-06-19 DIAGNOSIS — W268XXA Contact with other sharp object(s), not elsewhere classified, initial encounter: Secondary | ICD-10-CM | POA: Insufficient documentation

## 2018-06-19 DIAGNOSIS — Z23 Encounter for immunization: Secondary | ICD-10-CM | POA: Insufficient documentation

## 2018-06-19 DIAGNOSIS — Y999 Unspecified external cause status: Secondary | ICD-10-CM | POA: Insufficient documentation

## 2018-06-19 DIAGNOSIS — F17220 Nicotine dependence, chewing tobacco, uncomplicated: Secondary | ICD-10-CM | POA: Insufficient documentation

## 2018-06-19 DIAGNOSIS — Y939 Activity, unspecified: Secondary | ICD-10-CM | POA: Insufficient documentation

## 2018-06-19 DIAGNOSIS — Y929 Unspecified place or not applicable: Secondary | ICD-10-CM | POA: Insufficient documentation

## 2018-06-19 MED ORDER — BACITRACIN ZINC 500 UNIT/GM EX OINT
TOPICAL_OINTMENT | CUTANEOUS | Status: DC | PRN
  Administered 2018-06-19: 1 via TOPICAL
  Filled 2018-06-19: qty 1.8
  Filled 2018-06-19: qty 0.9

## 2018-06-19 MED ORDER — TETANUS-DIPHTHERIA TOXOIDS TD 5-2 LFU IM INJ
0.5000 mL | INJECTION | Freq: Once | INTRAMUSCULAR | Status: AC
  Administered 2018-06-19: 0.5 mL via INTRAMUSCULAR
  Filled 2018-06-19: qty 0.5

## 2018-06-19 MED ORDER — LIDOCAINE-EPINEPHRINE (PF) 2 %-1:200000 IJ SOLN
20.0000 mL | Freq: Once | INTRAMUSCULAR | Status: AC
  Administered 2018-06-19: 20 mL via INTRADERMAL
  Filled 2018-06-19: qty 20

## 2018-06-19 NOTE — ED Notes (Signed)
ED Provider at bedside. 

## 2018-06-19 NOTE — ED Triage Notes (Addendum)
Pt states she got cut by the [employer] metal ring. Lac is approximately 3 inches long on right arm.

## 2018-06-19 NOTE — Discharge Instructions (Addendum)
Sutures out in 7 to 10 days.  Use Tylenol and Motrin as needed for pain

## 2018-06-19 NOTE — ED Notes (Signed)
Patient given discharge teaching and verbalized understanding. Patient ambulated out of ED with a steady gait. 

## 2018-06-19 NOTE — ED Provider Notes (Signed)
[facility]-EMERGENCY DEPT Provider Note   CSN: [id number] Arrival date & time: 06/19/18  1326     History   Chief Complaint Chief Complaint  Patient presents with  . Extremity Laceration    HPI Amy [name] is a 37 y.o. female.  37 year old female who sustained a laceration to her right forearm with a sharp object just prior to arrival.  Had bleeding that was controlled with direct pressure.  Denies any distal numbness or tingling to her right hand.  Notes pain to that area described as dull and worse with any movement.  Denies any trouble moving her fingers.     Past Medical History:  Diagnosis Date  . Migraine     Patient Active Problem List   Diagnosis Date Noted  . 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 CONSTIPATION 12/16/2008  . OBESITY 10/12/2008  . MIGRAINE, COMMON 09/03/2008    Past Surgical History:  Procedure Laterality Date  . CESAREAN SECTION    . DILATION AND EVACUATION N/A 09/21/2015   Procedure: DILATATION AND EVACUATION;  Surgeon: Reva Boresanya S Pratt, MD;  Location: [facility] OR[facility];  Service: Gynecology;  Laterality: N/A;  . THERAPEUTIC ABORTION       OB History    Gravida  7   Para  2   Term  2   Preterm      AB  4   Living  2     SAB  1   TAB  3   Ectopic      Multiple      Live Births               Home Medications    Prior to Admission medications   Medication Sig Start Date End Date Taking? Authorizing Provider  pseudoephedrine (SUDAFED) 60 MG tablet Take 60 mg by mouth every 4 (four) hours as needed for congestion.   Yes [provider]  sodium chloride (OCEAN) 0.65 % SOLN nasal spray Place 1-2 sprays into both nostrils as needed for congestion. Reported on 10/07/2015   Yes [provider]  amoxicillin-clavulanate (AUGMENTIN) 875-125 MG tablet Take 1 tablet by mouth 2 (two) times daily. Patient not taking: Reported on 06/19/2018 05/22/16   Bethel BornGekas, Kelly Marie, PA-C     Family History Family History  Problem Relation Age of Onset  . Cancer Father     Social History Social History   Tobacco Use  . Smoking status: Former Smoker    Packs/day: 0.50  . Smokeless tobacco: Current User  Substance Use Topics  . Alcohol use: No  . Drug use: Never     Allergies   Shrimp [shellfish allergy]   Review of Systems Review of Systems  All other systems reviewed and are negative.    Physical Exam Updated Vital Signs BP 114/89   Pulse 64   Temp 98.6 F (37 C) (Oral)   Resp 16   Ht 1.727 m (5\' 8" )   Wt 78 kg   LMP 05/29/2018   SpO2 99%   BMI 26.15 kg/m   Physical Exam Vitals signs and nursing note reviewed.  Constitutional:      General: She is not in acute distress.    Appearance: Normal appearance. She is well-developed. She is not toxic-appearing.  HENT:     Head: Normocephalic and atraumatic.  Eyes:     General: Lids are normal.     Conjunctiva/sclera: Conjunctivae normal.     Pupils: Pupils are equal, round,  and reactive to light.  Neck:     Musculoskeletal: Normal range of motion and neck supple.     Thyroid: No thyroid mass.     Trachea: No tracheal deviation.  Cardiovascular:     Rate and Rhythm: Normal rate and regular rhythm.     Heart sounds: Normal heart sounds. No murmur. No gallop.   Pulmonary:     Effort: Pulmonary effort is normal. No respiratory distress.     Breath sounds: Normal breath sounds. No stridor. No decreased breath sounds, wheezing, rhonchi or rales.  Abdominal:     General: Bowel sounds are normal. There is no distension.     Palpations: Abdomen is soft.     Tenderness: There is no abdominal tenderness. There is no rebound.  Musculoskeletal: Normal range of motion.        General: No tenderness.       Arms:     Comments: Normal flexion and extension at digits on the right hand.  Skin:    General: Skin is warm and dry.     Findings: No abrasion or rash.  Neurological:     Mental Status: She  is alert and oriented to person, place, and time.     GCS: GCS eye subscore is 4. GCS verbal subscore is 5. GCS motor subscore is 6.     Cranial Nerves: No cranial nerve deficit.     Sensory: No sensory deficit.  Psychiatric:        Speech: Speech normal.        Behavior: Behavior normal.      ED Treatments / Results  Labs (all labs ordered are listed, but only abnormal results are displayed) Labs Reviewed - No data to display  EKG None  Radiology No results found.  Procedures Procedures (including critical care time)  Medications Ordered in ED Medications  bacitracin ointment (has no administration in time range)  tetanus & diphtheria toxoids (adult) (TENIVAC) injection 0.5 mL (has no administration in time range)  lidocaine-EPINEPHrine (XYLOCAINE W/EPI) 2 %-1:200000 (PF) injection 20 mL (20 mLs Intradermal Given by Other 06/19/18 1410)     Initial Impression / Assessment and Plan / ED Course  I have reviewed the triage vital signs and the nursing notes.  Pertinent labs & imaging results that were available during my care of the patient were reviewed by me and considered in my medical decision making (see chart for details).     LACERATION REPAIR Performed by: Toy BakerAnthony T Steffani Dionisio Authorized by: Toy BakerAnthony T Deshundra Waller Consent: Verbal consent obtained. Risks and benefits: risks, benefits and alternatives were discussed Consent given by: patient Patient identity confirmed: provided demographic data Prepped and Draped in normal sterile fashion Wound explored  Laceration Location: Right forearm  Laceration Length: 3 cm  No Foreign Bodies seen or palpated  Anesthesia: local infiltration  Local anesthetic: lidocaine 1% % with epinephrine  Anesthetic total: 10 ml  Irrigation method: syringe Amount of cleaning: standard  Skin closure: Simple  Number of sutures: 13  Technique: Simple  Patient tolerance: Patient tolerated the procedure well with no immediate  complications.  Tetanus status updated.  Return precautions given  Final Clinical Impressions(s) / ED Diagnoses   Final diagnoses:  None    ED Discharge Orders    None       Lorre NickAllen, Trena Dunavan, MD 06/19/18 1450

## 2018-06-26 NOTE — L&D Delivery Note (Signed)
OB/[facility]N Faculty Practice Delivery Note  Amy [name] is a 38 y.o. [id number] s/p VBAC at [redacted]w[redacted]d. She was admitted for IOL for gHTN.   ROM: 11h 10m with clear fluid GBS Status:  Negative (08/12 0000) Maximum Maternal Temperature: 98.7 F    Labor Progress: . Induced with foley bulb, pitocin, and AROM and reached 10 cm  Delivery Date/Time: 02/14/2019 at 0709 Delivery: Called to room and patient was complete and pushing. Head delivered with maternal effort. No nuchal cord present. Shoulder and body delivered in usual fashion. Infant with spontaneous cry, placed on mother's abdomen, dried and stimulated. Cord clamped x 2 after 1-minute delay, and cut by father. Cord blood drawn. Placenta delivered spontaneously with gentle cord traction. Fundus firm with massage and Pitocin. Labia, perineum, vagina, and cervix inspected with first degree perineal repaired in usual fashion as well as bilateral periuretheral lacerations that were hemostatic and not repaired. After repair ongoing trickling and intermittent poor uterine tone was noted, for which 487mcg buccal misoprostol was given.   Placenta: 3v, intact Complications: none Lacerations: 1st degree perineal, repaired; bilateral periuretheral, not repaired EBL: 300 Analgesia: epidural    Infant: Apgars 8, [place], MD/[facility] OB/GYN Fellow, Faculty Practice

## 2018-07-02 ENCOUNTER — Telehealth: Payer: Self-pay | Admitting: Family Medicine

## 2018-07-02 NOTE — Telephone Encounter (Signed)
Called in stated she took an at home pregnancy test to confirm pregnancy. Informed the patient she would have to come in to our office to have the test taken and would move forward with an appointment. Patient would like to visit our walk in clinic. Informed of the times.

## 2018-07-03 ENCOUNTER — Encounter: Payer: Self-pay | Admitting: Family Medicine

## 2018-07-03 ENCOUNTER — Ambulatory Visit (INDEPENDENT_AMBULATORY_CARE_PROVIDER_SITE_OTHER): Payer: Medicaid Other

## 2018-07-03 VITALS — BP 143/84 | HR 78

## 2018-07-03 DIAGNOSIS — Z3201 Encounter for pregnancy test, result positive: Secondary | ICD-10-CM

## 2018-07-03 DIAGNOSIS — Z3200 Encounter for pregnancy test, result unknown: Secondary | ICD-10-CM

## 2018-07-03 LAB — POCT PREGNANCY, URINE: Preg Test, Ur: POSITIVE — AB

## 2018-07-03 NOTE — Progress Notes (Signed)
Patient is here for a (+)UPT. LMP 05/03/2018 unsure by app is EDD is 02/07/2019 8w 5d. Advised patient to begin taking Prenatal vitamins and to start prenatal care, stated she got some prenatals from [employer]. Patient is excited to be pregnant again. Patient miscarried around christrmas 2017.

## 2018-07-04 NOTE — Addendum Note (Signed)
Addended by: Ernestina Patches on: 07/04/2018 04:40 PM   Modules accepted: Orders

## 2018-07-05 ENCOUNTER — Inpatient Hospital Stay (HOSPITAL_COMMUNITY)
Admission: AD | Admit: 2018-07-05 | Discharge: 2018-07-05 | Disposition: A | Discharge status: Home / Self Care | Payer: Medicaid Other | Source: Ambulatory Visit | Attending: Obstetrics and Gynecology | Admitting: Obstetrics and Gynecology

## 2018-07-05 ENCOUNTER — Encounter (HOSPITAL_COMMUNITY): Payer: Self-pay | Admitting: *Deleted

## 2018-07-05 ENCOUNTER — Inpatient Hospital Stay (HOSPITAL_COMMUNITY): Payer: Medicaid Other

## 2018-07-05 DIAGNOSIS — Z3A01 Less than 8 weeks gestation of pregnancy: Secondary | ICD-10-CM | POA: Insufficient documentation

## 2018-07-05 DIAGNOSIS — O200 Threatened abortion: Secondary | ICD-10-CM | POA: Diagnosis not present

## 2018-07-05 DIAGNOSIS — O209 Hemorrhage in early pregnancy, unspecified: Secondary | ICD-10-CM | POA: Diagnosis present

## 2018-07-05 DIAGNOSIS — Z87891 Personal history of nicotine dependence: Secondary | ICD-10-CM | POA: Insufficient documentation

## 2018-07-05 HISTORY — DX: Herpesviral infection, unspecified: B00.9

## 2018-07-05 LAB — WET PREP, GENITAL
Clue Cells Wet Prep HPF POC: NONE SEEN
Sperm: NONE SEEN
TRICH WET PREP: NONE SEEN
YEAST WET PREP: NONE SEEN

## 2018-07-05 LAB — COMPREHENSIVE METABOLIC PANEL
ALBUMIN: 4 g/dL (ref 3.5–5.0)
ALT: 12 U/L (ref 0–44)
ANION GAP: 5 (ref 5–15)
AST: 15 U/L (ref 15–41)
Alkaline Phosphatase: 33 U/L — ABNORMAL LOW (ref 38–126)
BILIRUBIN TOTAL: 0.7 mg/dL (ref 0.3–1.2)
BUN: 12 mg/dL (ref 6–20)
CHLORIDE: 107 mmol/L (ref 98–111)
CO2: 24 mmol/L (ref 22–32)
Calcium: 9 mg/dL (ref 8.9–10.3)
Creatinine, Ser: 0.73 mg/dL (ref 0.44–1.00)
GFR calc Af Amer: >60 mL/min (ref >60)
Glucose, Bld: 91 mg/dL (ref 70–99)
POTASSIUM: 4.1 mmol/L (ref 3.5–5.1)
Sodium: 136 mmol/L (ref 135–145)
TOTAL PROTEIN: 7.2 g/dL (ref 6.5–8.1)

## 2018-07-05 LAB — CBC WITH DIFFERENTIAL/PLATELET
BASOS PCT: 1 %
Basophils Absolute: 0 10*3/uL (ref 0.0–0.1)
EOS PCT: 1 %
Eosinophils Absolute: 0.1 10*3/uL (ref 0.0–0.5)
HEMATOCRIT: 38.7 % (ref 36.0–46.0)
Hemoglobin: 13 g/dL (ref 12.0–15.0)
Lymphocytes Relative: 29 %
Lymphs Abs: 1.8 10*3/uL (ref 0.7–4.0)
MCH: 30.4 pg (ref 26.0–34.0)
MCHC: 33.6 g/dL (ref 30.0–36.0)
MCV: 90.6 fL (ref 80.0–100.0)
MONO ABS: 0.4 10*3/uL (ref 0.1–1.0)
MONOS PCT: 6 %
NEUTROS ABS: 4 10*3/uL (ref 1.7–7.7)
Neutrophils Relative %: 63 %
PLATELETS: 230 10*3/uL (ref 150–400)
RBC: 4.27 MIL/uL (ref 3.87–5.11)
RDW: 13.2 % (ref 11.5–15.5)
WBC: 6.3 10*3/uL (ref 4.0–10.5)
nRBC: 0 % (ref 0.0–0.2)

## 2018-07-05 LAB — HCG, QUANTITATIVE, PREGNANCY: HCG, BETA CHAIN, QUANT, S: 18196 m[IU]/mL — AB (ref <5)

## 2018-07-05 LAB — URINALYSIS, ROUTINE W REFLEX MICROSCOPIC
BILIRUBIN URINE: NEGATIVE
GLUCOSE, UA: NEGATIVE mg/dL
Ketones, ur: NEGATIVE mg/dL
LEUKOCYTES UA: NEGATIVE
NITRITE: NEGATIVE
PROTEIN: NEGATIVE mg/dL
Specific Gravity, Urine: 1.006 (ref 1.005–1.030)
pH: 7 (ref 5.0–8.0)

## 2018-07-05 LAB — ABO/RH: ABO/RH(D): O POS

## 2018-07-05 NOTE — MAU Note (Signed)
Pt woke up seeing pink blood with wiping, a small amount in underwear on arrival to MAU.  Hx of miscarriages.  Denies pain.

## 2018-07-05 NOTE — MAU Note (Signed)
Urine in lab 

## 2018-07-05 NOTE — MAU Provider Note (Signed)
History     CSN: [id number]  Arrival date and time: 07/05/18 [id number]   First Provider Initiated Contact with Patient 07/05/18 [phone]      Chief Complaint  Patient presents with  . Vaginal Bleeding   HPI   Ms.Amy Adam.Starlette M [name] is a 38 y.o. female [id number] @ 7777w2d here with complaints of vaginal bleeding. She has a history of miscarriage and is nervous. This is not a planned pregnancy. She noticed the spotting this morning however it was pink only. The spotting progressed to bright red blood. She says the bleeding is light currently. No blood clots noted. No pain at this time.   OB History    Gravida  8   Para  2   Term  2   Preterm      AB  5   Living  2     SAB  2   TAB  3   Ectopic      Multiple      Live Births              Past Medical History:  Diagnosis Date  . HSV infection   . Migraine     Past Surgical History:  Procedure Laterality Date  . CESAREAN SECTION    . DILATION AND EVACUATION N/A 09/21/2015   Procedure: DILATATION AND EVACUATION;  Surgeon: Reva Boresanya S Pratt, MD;  Location: [facility];  Service: Gynecology;  Laterality: N/A;  . THERAPEUTIC ABORTION    . WISDOM TOOTH EXTRACTION      Family History  Problem Relation Age of Onset  . Cancer Father     Social History   Tobacco Use  . Smoking status: Former Smoker    Packs/day: 0.50  . Smokeless tobacco: Current User  Substance Use Topics  . Alcohol use: No  . Drug use: Never    Allergies:  Allergies  Allergen Reactions  . Shrimp [Shellfish Allergy] Swelling    Swollen Lips    Medications Prior to Admission  Medication Sig Dispense Refill Last Dose  . amoxicillin-clavulanate (AUGMENTIN) 875-125 MG tablet Take 1 tablet by mouth 2 (two) times daily. (Patient not taking: Reported on 06/19/2018) 10 tablet 0 Completed Course at Unknown time  . pseudoephedrine (SUDAFED) 60 MG tablet Take 60 mg by mouth every 4 (four) hours as needed for congestion.   Past Week at Unknown time  . sodium  chloride (OCEAN) 0.65 % SOLN nasal spray Place 1-2 sprays into both nostrils as needed for congestion. Reported on 10/07/2015   06/19/2018 at Unknown time   Results for orders placed or performed during the hospital encounter of 07/05/18 (from the past 48 hour(s))  Urinalysis, Routine w reflex microscopic     Status: Abnormal   Collection Time: 07/05/18  8:38 AM  Result Value Ref Range   Color, Urine STRAW (A) YELLOW   APPearance CLEAR CLEAR   Specific Gravity, Urine 1.006 1.005 - 1.030   pH 7.0 5.0 - 8.0   Glucose, UA NEGATIVE NEGATIVE mg/dL   Hgb urine dipstick LARGE (A) NEGATIVE   Bilirubin Urine NEGATIVE NEGATIVE   Ketones, ur NEGATIVE NEGATIVE mg/dL   Protein, ur NEGATIVE NEGATIVE mg/dL   Nitrite NEGATIVE NEGATIVE   Leukocytes, UA NEGATIVE NEGATIVE   RBC / HPF 0-5 0 - 5 RBC/hpf   WBC, UA 0-5 0 - 5 WBC/hpf   Bacteria, UA RARE (A) NONE SEEN   Squamous Epithelial / LPF 0-5 0 - 5   Mucus PRESENT  Comment: Performed at [facility], [address], [place], [place] [address]  CBC with Differential/Platelet     Status: None   Collection Time: 07/05/18 10:36 AM  Result Value Ref Range   WBC 6.3 4.0 - 10.5 K/uL   RBC 4.27 3.87 - 5.11 MIL/uL   Hemoglobin 13.0 12.0 - 15.0 g/dL   HCT 81.2 75.1 - 70.0 %   MCV 90.6 80.0 - 100.0 fL   MCH 30.4 26.0 - 34.0 pg   MCHC 33.6 30.0 - 36.0 g/dL   RDW 17.4 94.4 - 96.7 %   Platelets 230 150 - 400 K/uL   nRBC 0.0 0.0 - 0.2 %   Neutrophils Relative % 63 %   Neutro Abs 4.0 1.7 - 7.7 K/uL   Lymphocytes Relative 29 %   Lymphs Abs 1.8 0.7 - 4.0 K/uL   Monocytes Relative 6 %   Monocytes Absolute 0.4 0.1 - 1.0 K/uL   Eosinophils Relative 1 %   Eosinophils Absolute 0.1 0.0 - 0.5 K/uL   Basophils Relative 1 %   Basophils Absolute 0.0 0.0 - 0.1 K/uL    Comment: Performed at [facility], [address] [place], [place] [address]  Comprehensive metabolic panel     Status: Abnormal   Collection Time: 07/05/18 10:36 AM  Result Value Ref  Range   Sodium 136 135 - 145 mmol/L   Potassium 4.1 3.5 - 5.1 mmol/L   Chloride 107 98 - 111 mmol/L   CO2 24 22 - 32 mmol/L   Glucose, Bld 91 70 - 99 mg/dL   BUN 12 6 - 20 mg/dL   Creatinine, Ser 8.46 0.44 - 1.00 mg/dL   Calcium 9.0 8.9 - 65.9 mg/dL   Total Protein 7.2 6.5 - 8.1 g/dL   Albumin 4.0 3.5 - 5.0 g/dL   AST 15 15 - 41 U/L   ALT 12 0 - 44 U/L   Alkaline Phosphatase 33 (L) 38 - 126 U/L   Total Bilirubin 0.7 0.3 - 1.2 mg/dL   GFR calc non Af Amer >60 >60 mL/min   GFR calc Af Amer >60 >60 mL/min   Anion gap 5 5 - 15    Comment: Performed at [facility], [address] [place], [place] [address]  ABO/Rh     Status: None   Collection Time: 07/05/18 10:36 AM  Result Value Ref Range   ABO/RH(D)      O POS Performed at [facility], [address], [place], [place] [address]   hCG, quantitative, pregnancy     Status: Abnormal   Collection Time: 07/05/18 10:36 AM  Result Value Ref Range   hCG, Beta Chain, Quant, S 18,196 (H) <5 mIU/mL    Comment:          GEST. AGE      CONC.  (mIU/mL)   <=1 WEEK        5 - 50     2 WEEKS       50 - 500     3 WEEKS       100 - 10,000     4 WEEKS     1,000 - 30,000     5 WEEKS     3,500 - 115,000   6-8 WEEKS     12,000 - 270,000    12 WEEKS     15,000 - 220,000        FEMALE AND NON-PREGNANT FEMALE:     LESS THAN 5 mIU/mL Performed at  [facility], [address], [place], [place] [address]   Wet prep, genital     Status: Abnormal   Collection Time: 07/05/18 12:29 PM  Result Value Ref Range   Yeast Wet Prep HPF POC NONE SEEN NONE SEEN   Trich, Wet Prep NONE SEEN NONE SEEN   Clue Cells Wet Prep HPF POC NONE SEEN NONE SEEN   WBC, Wet Prep HPF POC FEW (A) NONE SEEN    Comment: MODERATE BACTERIA SEEN   Sperm NONE SEEN     Comment: Performed at [facility], [address] [place], [place] [address]   US Ob Less Than 14 Weeks With Ob Transvaginal  Result Date: 07/05/2018 CLINICAL DATA:  Vaginal bleeding.  First  trimester pregnancy. EXAM: OBSTETRIC <14 WK [place] AND TRANSVAGINAL OB US TECHNIQUE: Transvaginal ultrasound was performed for complete evaluation of the gestation as well as the maternal uterus, adnexal regions, and pelvic cul-de-sac. COMPARISON:  09/19/2015 FINDINGS: Intrauterine gestational sac: Single Yolk sac:  Visualized. Embryo:  Not visualized MSD: 11.6 mm   5 w   6 d Maternal uterus/adnexae: Subchorionic hemorrhage: none visualized Right ovary: Normal Left ovary: Normal Other :None Free fluid:  None IMPRESSION: 1. Probable early intrauterine gestational sac containing a yolk sac, but no fetal pole, or cardiac activity yet visualized. Recommend follow-up quantitative B-HCG levels and follow-up US in 14 days to assess viability. This recommendation follows SRU consensus guidelines: Diagnostic Criteria for Nonviable Pregnancy Early in the First Trimester. Malva Limes Med 2013; 845:3646-80. Electronically Signed   By: Signa Kell M.D.   On: 07/05/2018 11:25   Review of Systems  Constitutional: Negative for fever.  Gastrointestinal: Negative for abdominal pain.  Genitourinary: Positive for vaginal bleeding.   Physical Exam   Blood pressure 105/71, pulse 90, temperature 98.7 F (37.1 C), temperature source Oral, resp. rate 16, height 5\' 8"  (1.727 m), weight 82.6 kg, last menstrual period 05/29/2018, unknown if currently breastfeeding.  Physical Exam  Constitutional: She is oriented to person, place, and time. She appears well-developed and well-nourished. No distress.  HENT:  Head: Normocephalic.  Respiratory: Effort normal.  GI: Bowel sounds are normal. She exhibits no distension. There is no abdominal tenderness. There is no rebound and no guarding.  Genitourinary:    Genitourinary Comments: Vagina - Small amount of mucoid red vaginal discharge, no odor  Cervix - No contact bleeding, no active bleeding  Bimanual exam: Cervix closed, posterior  Uterus non tender, normal size Adnexa non tender,  no masses bilaterally GC/Chlam, wet prep done Chaperone present for exam.    Musculoskeletal: Normal range of motion.  Neurological: She is alert and oriented to person, place, and time.  Skin: Skin is warm. She is not diaphoretic.  Psychiatric: Her behavior is normal.    MAU Course  Procedures  None  MDM  O positive blood type  Wet prep & GC HIV, CBC, Hcg, ABO US OB transvaginal   Assessment and Plan   A:  SIUP @ [redacted]w[redacted]d 1. Threatened miscarriage in early pregnancy   2. Vaginal bleeding in pregnancy, first trimester     P:  Discharge home in stable condition Return to MAU if symptoms worsen Bleeding precautions Pelvic rest Start prenatal care  Prenatal vitamins daily   Rasch, Harolyn Rutherford, NP 07/05/2018 6:59 PM

## 2018-07-05 NOTE — Discharge Instructions (Signed)
Threatened Miscarriage  A threatened miscarriage occurs when a woman has vaginal bleeding during the first 20 weeks of pregnancy but the pregnancy has not ended. If you have vaginal bleeding during this time, your health care provider will do tests to make sure you are still pregnant. If the tests show that you are still pregnant and that the developing baby (fetus) inside your uterus is still growing, your condition is considered a threatened miscarriage. A threatened miscarriage does not mean your pregnancy will end, but it does increase the risk of losing your pregnancy (complete miscarriage). What are the causes? The cause of this condition is usually not known. For women who go on to have a complete miscarriage, the most common cause is an abnormal number of chromosomes in the developing baby. Chromosomes are the structures inside cells that hold all of a person's genetic material. What increases the risk? The following lifestyle factors may increase your risk of a miscarriage in early pregnancy:  Smoking.  Drinking excessive amounts of alcohol or caffeine.  Recreational drug use. The following preexisting health conditions may increase your risk of a miscarriage in early pregnancy:  Polycystic ovary syndrome.  Uterine fibroids.  Infections.  Diabetes mellitus. What are the signs or symptoms? Symptoms of this condition include:  Vaginal bleeding.  Mild abdominal pain or cramps. How is this diagnosed? If you have bleeding with or without abdominal pain before 20 weeks of pregnancy, your health care provider will do tests to check whether you are still pregnant. These will include:  Ultrasound. This test uses sound waves to create images of the inside of your uterus. This allows your health care provider to look at your developing baby and other structures, such as your placenta.  Pelvic exam. This is an internal exam of your vagina and cervix.  Measurement of your baby's heart  rate.  Laboratory tests such as blood tests, urine tests, or swabs for infection You may be diagnosed with a threatened miscarriage if:  Ultrasound testing shows that you are still pregnant.  Your babys heart rate is strong.  A pelvic exam shows that the opening between your uterus and your vagina (cervix) is closed.  Blood tests confirm that you are still pregnant. How is this treated? No treatments have been shown to prevent a threatened miscarriage from going on to a complete miscarriage. However, the right home care is important. Follow these instructions at home:  Get plenty of rest.  Do not have sex or use tampons if you have vaginal bleeding.  Do not douche.  Do not smoke or use recreational drugs.  Do not drink alcohol.  Avoid caffeine.  Keep all follow-up prenatal visits as told by your health care provider. This is important. Contact a health care provider if:  You have light vaginal bleeding or spotting while pregnant.  You have abdominal pain or cramping.  You have a fever. Get help right away if:  You have heavy vaginal bleeding.  You have blood clots coming from your vagina.  You pass tissue from your vagina.  You leak fluid, or you have a gush of fluid from your vagina.  You have severe low back pain or abdominal cramps.  You have fever, chills, and severe abdominal pain. Summary  A threatened miscarriage occurs when a woman has vaginal bleeding during the first 20 weeks of pregnancy but the pregnancy has not ended.  The cause of a threatened miscarriage is usually not known.  Symptoms of this condition may  include vaginal bleeding and mild abdominal pain or cramps. °· No treatments have been shown to prevent a threatened miscarriage from going on to a complete miscarriage. °· Keep all follow-up prenatal visits as told by your health care provider. This is important. °This information is not intended to replace advice given to you by your health  care provider. Make sure you discuss any questions you have with your health care provider. °Document Released: 06/12/2005 Document Revised: 09/08/2016 Document Reviewed: 09/08/2016 °Elsevier Interactive Patient Education © 2019 Else[employer] ° ° °Vaginal Bleeding During Pregnancy, First Trimester ° °A small amount of bleeding from the vagina (spotting) is relatively common during early pregnancy. It usually stops on its own. Various things may cause bleeding or spotting during early pregnancy. Some bleeding may be related to the pregnancy, and some may not. In many cases, the bleeding is normal and is not a problem. However, bleeding can also be a sign of something serious. Be sure to tell your health care provider about any vaginal bleeding right away. °Some possible causes of vaginal bleeding during the first trimester include: °· Infection or inflammation of the cervix. °· Growths (polyps) on the cervix. °· Miscarriage or threatened miscarriage. °· Pregnancy tissue developing outside of the uterus (ectopic pregnancy). °· A mass of tissue developing in the uterus due to an egg being fertilized incorrectly (molar pregnancy). °Follow these instructions at home: °Activity °· Follow instructions from your health care provider about limiting your activity. Ask what activities are safe for you. °· If needed, make plans for someone to help with your regular activities. °· Do not have sex or orgasms until your health care provider says that this is safe. °General instructions °· Take over-the-counter and prescription medicines only as told by your health care provider. °· Pay attention to any changes in your symptoms. °· Do not use tampons or douche. °· Write down how many pads you use each day, how often you change pads, and how soaked (saturated) they are. °· If you pass any tissue from your vagina, save the tissue so you can show it to your health care provider. °· Keep all follow-up visits as told by your health care  provider. This is important. °Contact a health care provider if: °· You have vaginal bleeding during any part of your pregnancy. °· You have cramps or labor pains. °· You have a fever. °Get help right away if: °· You have severe cramps in your back or abdomen. °· You pass large clots or a large amount of tissue from your vagina. °· Your bleeding increases. °· You feel light-headed or weak, or you faint. °· You have chills. °· You are leaking fluid or have a gush of fluid from your vagina. °Summary °· A small amount of bleeding (spotting) from the vagina is relatively common during early pregnancy. °· Various things may cause bleeding or spotting in early pregnancy. °· Be sure to tell your health care provider about any vaginal bleeding right away. °This information is not intended to replace advice given to you by your health care provider. Make sure you discuss any questions you have with your health care provider. °Document Released: 03/22/2005 Document Revised: 09/14/2016 Document Reviewed: 09/14/2016 °Elsevier Interactive Patient Education © 2019 Elsevier Inc. ° °

## 2018-07-05 NOTE — MAU Provider Note (Signed)
History    Chief Complaint  Patient presents with  . Vaginal Bleeding   Amy [name] is a [id number] YOF at [redacted]w[redacted]d by LMP who presents with vaginal bleeding. She noticed pink spotting when wiping this morning that has progressed to bright red blood. Bleeding is currently mild, has not saturated a pad. No clots. She had mild abdominal cramping on Tuesday but denies current abdominal pain. She is otherwise asymptomatic. She has a hx of two prior miscarriages.    OB History    Gravida  8   Para  2   Term  2   Preterm      AB  5   Living  2     SAB  2   TAB  3   Ectopic      Multiple      Live Births              Past Medical History:  Diagnosis Date  . HSV infection   . Migraine     Past Surgical History:  Procedure Laterality Date  . CESAREAN SECTION    . DILATION AND EVACUATION N/A 09/21/2015   Procedure: DILATATION AND EVACUATION;  Surgeon: Reva Bores, MD;  Location: [facility];  Service: Gynecology;  Laterality: N/A;  . THERAPEUTIC ABORTION    . WISDOM TOOTH EXTRACTION      Family History  Problem Relation Age of Onset  . Cancer Father     Social History   Tobacco Use  . Smoking status: Former Smoker    Packs/day: 0.50  . Smokeless tobacco: Current User  Substance Use Topics  . Alcohol use: No  . Drug use: Never    Allergies:  Allergies  Allergen Reactions  . Shrimp [Shellfish Allergy] Swelling    Swollen Lips    Medications Prior to Admission  Medication Sig Dispense Refill Last Dose  . amoxicillin-clavulanate (AUGMENTIN) 875-125 MG tablet Take 1 tablet by mouth 2 (two) times daily. (Patient not taking: Reported on 06/19/2018) 10 tablet 0 Completed Course at Unknown time  . pseudoephedrine (SUDAFED) 60 MG tablet Take 60 mg by mouth every 4 (four) hours as needed for congestion.   Past Week at Unknown time  . sodium chloride (OCEAN) 0.65 % SOLN nasal spray Place 1-2 sprays into both nostrils as needed for congestion. Reported on 10/07/2015    06/19/2018 at Unknown time    Review of Systems  Constitutional: Negative.   Eyes: Negative.   Respiratory: Negative.   Cardiovascular: Negative.   Gastrointestinal: Negative for constipation, diarrhea, nausea and vomiting.  Genitourinary: Negative.   Skin: Negative.   Neurological: Negative.    Physical Exam Blood pressure 105/71, pulse 90, temperature 98.7 F (37.1 C), temperature source Oral, resp. rate 16, height 5\' 8"  (1.727 m), weight 82.6 kg, last menstrual period 05/29/2018, unknown if currently breastfeeding. Physical Exam  Constitutional: She is oriented to person, place, and time. She appears well-nourished.  Cardiovascular: Normal rate, regular rhythm and normal heart sounds.  Respiratory: Effort normal and breath sounds normal.  GI: She exhibits no distension and no mass. There is no guarding.  Mild tenderness in suprapubic region  Musculoskeletal:        General: No edema.  Neurological: She is oriented to person, place, and time.  Skin: Skin is warm and dry.  Psychiatric: She has a normal mood and affect.   MAU Course Procedures  MDM -[place], bHCG 18,196 , CBC unremarkable, CMP unremarkable, UA - Blood type O+ [place]:  Probable early intrauterine gestational sac containing a yolk sac, but no fetal pole, or cardiac activity yet visualized. - UA: Neg nitrite, neg leuk, rare bacteria  Assessment and Plan - Vaginal bleeding, threatened miscarriage. Ectopic ruled out. Return precautions given including increase in bleeding, pain.

## 2018-07-08 LAB — GC/CHLAMYDIA PROBE AMP (~~LOC~~) NOT AT ARMC
Chlamydia: NEGATIVE
Neisseria Gonorrhea: NEGATIVE

## 2018-07-18 ENCOUNTER — Ambulatory Visit (HOSPITAL_COMMUNITY)
Admission: RE | Admit: 2018-07-18 | Discharge: 2018-07-18 | Disposition: A | Discharge status: Home / Self Care | Payer: Medicaid Other | Source: Ambulatory Visit | Attending: Obstetrics and Gynecology | Admitting: Obstetrics and Gynecology

## 2018-07-18 ENCOUNTER — Ambulatory Visit (INDEPENDENT_AMBULATORY_CARE_PROVIDER_SITE_OTHER): Payer: Self-pay

## 2018-07-18 DIAGNOSIS — Z712 Person consulting for explanation of examination or test findings: Secondary | ICD-10-CM

## 2018-07-18 DIAGNOSIS — Z3200 Encounter for pregnancy test, result unknown: Secondary | ICD-10-CM | POA: Insufficient documentation

## 2018-07-18 NOTE — Progress Notes (Signed)
Pt here today for OB US results.  Pt denies any bleeding or pain.  Pt informed that her US shows a viable pregnancy and that she should start prenatal care.  Medications reconciled.  List of medications safe to take in pregnancy given.  Front office to provide proof of letter to start prenatal care.

## 2018-08-09 ENCOUNTER — Ambulatory Visit (INDEPENDENT_AMBULATORY_CARE_PROVIDER_SITE_OTHER): Payer: Medicaid Other | Admitting: Obstetrics & Gynecology

## 2018-08-09 ENCOUNTER — Encounter: Payer: Self-pay | Admitting: Obstetrics & Gynecology

## 2018-08-09 ENCOUNTER — Other Ambulatory Visit (HOSPITAL_COMMUNITY)
Admission: RE | Admit: 2018-08-09 | Discharge: 2018-08-09 | Disposition: A | Discharge status: Home / Self Care | Payer: Medicaid Other | Source: Ambulatory Visit | Attending: Obstetrics & Gynecology | Admitting: Obstetrics & Gynecology

## 2018-08-09 ENCOUNTER — Encounter: Payer: Self-pay | Admitting: Family Medicine

## 2018-08-09 VITALS — BP 136/76 | HR 99 | Wt 194.9 lb

## 2018-08-09 DIAGNOSIS — B009 Herpesviral infection, unspecified: Secondary | ICD-10-CM | POA: Diagnosis not present

## 2018-08-09 DIAGNOSIS — Z98891 History of uterine scar from previous surgery: Secondary | ICD-10-CM | POA: Diagnosis not present

## 2018-08-09 DIAGNOSIS — O0990 Supervision of high risk pregnancy, unspecified, unspecified trimester: Secondary | ICD-10-CM | POA: Insufficient documentation

## 2018-08-09 DIAGNOSIS — Z23 Encounter for immunization: Secondary | ICD-10-CM | POA: Diagnosis not present

## 2018-08-09 DIAGNOSIS — Z3481 Encounter for supervision of other normal pregnancy, first trimester: Secondary | ICD-10-CM

## 2018-08-09 DIAGNOSIS — Z3480 Encounter for supervision of other normal pregnancy, unspecified trimester: Secondary | ICD-10-CM

## 2018-08-09 DIAGNOSIS — O0991 Supervision of high risk pregnancy, unspecified, first trimester: Secondary | ICD-10-CM | POA: Diagnosis present

## 2018-08-09 DIAGNOSIS — O09521 Supervision of elderly multigravida, first trimester: Secondary | ICD-10-CM

## 2018-08-09 DIAGNOSIS — F439 Reaction to severe stress, unspecified: Secondary | ICD-10-CM

## 2018-08-09 DIAGNOSIS — O09529 Supervision of elderly multigravida, unspecified trimester: Secondary | ICD-10-CM | POA: Insufficient documentation

## 2018-08-09 NOTE — Addendum Note (Signed)
Addended by: Gwendlyn Deutscher A on: 08/09/2018 09:47 AM   Modules accepted: Orders

## 2018-08-09 NOTE — Addendum Note (Signed)
Addended by: Allie Bossier on: 08/09/2018 09:49 AM   Modules accepted: Orders

## 2018-08-09 NOTE — Progress Notes (Addendum)
  Subjective:    Amy [name] is being seen today for her first obstetrical visit.  This is a planned pregnancy. She is at [redacted]w[redacted]d gestation. Her obstetrical history is significant for advanced maternal age and previous c/s.  Relationship with FOB: not involved ("He's an addict.) Patient does intend to breast feed. Pregnancy history fully reviewed.  Patient reports no complaints.  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   Objective:     BP 136/76   Pulse 99   Wt 194 lb 14.4 oz (88.4 kg)   LMP 05/29/2018   BMI 29.63 kg/m  Physical Exam  Exam normal size and shape, anteverted, mobile, non-tender, normal adnexal exam Well nourished, well hydrated White female, no apparent distress Heart- rrr Lungs- CTAB Abd- benign Cervix, vulva, vagina- normal   Assessment:    Pregnancy: [id number] Patient Active Problem List   Diagnosis Date Noted  . Supervision of high risk pregnancy, antepartum 08/09/2018  . Supervision of other normal pregnancy, antepartum 08/09/2018  . AMA (advanced maternal age) multigravida 35+ 08/09/2018  . 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 CONSTIPATION 12/16/2008  . OBESITY 10/12/2008  . MIGRAINE, COMMON 09/03/2008       Plan:     Initial labs drawn. Prenatal vitamins. Problem list reviewed and updated. NIPS at next visit Role of ultrasound in pregnancy discussed; fetal survey: ordered. (detailed due to AMA) Rec PNV  Flu vaccine given today Info given on TOLAC versus RLTCS Appt with Jerilee Field 08/09/2018

## 2018-08-09 NOTE — Progress Notes (Signed)
OB prenatal packet.

## 2018-08-10 LAB — PROTEIN / CREATININE RATIO, URINE
CREATININE, UR: 151.1 mg/dL
PROTEIN/CREAT RATIO: 64 mg/g{creat} (ref 0–200)
Protein, Ur: 9.7 mg/dL

## 2018-08-11 LAB — URINE CULTURE, OB REFLEX: ORGANISM ID, BACTERIA: NO GROWTH

## 2018-08-11 LAB — CULTURE, OB URINE

## 2018-08-13 LAB — CYTOLOGY - PAP
CHLAMYDIA, DNA PROBE: NEGATIVE
DIAGNOSIS: NEGATIVE
HPV (WINDOPATH): NOT DETECTED
NEISSERIA GONORRHEA: NEGATIVE

## 2018-08-30 ENCOUNTER — Encounter: Payer: Self-pay | Admitting: Obstetrics & Gynecology

## 2018-08-30 ENCOUNTER — Ambulatory Visit (INDEPENDENT_AMBULATORY_CARE_PROVIDER_SITE_OTHER): Payer: Self-pay | Admitting: Obstetrics & Gynecology

## 2018-08-30 VITALS — BP 137/75 | HR 89 | Wt 198.7 lb

## 2018-08-30 DIAGNOSIS — O09521 Supervision of elderly multigravida, first trimester: Secondary | ICD-10-CM

## 2018-08-30 DIAGNOSIS — O0990 Supervision of high risk pregnancy, unspecified, unspecified trimester: Secondary | ICD-10-CM

## 2018-08-30 DIAGNOSIS — Z3A13 13 weeks gestation of pregnancy: Secondary | ICD-10-CM

## 2018-08-30 DIAGNOSIS — Z3689 Encounter for other specified antenatal screening: Secondary | ICD-10-CM

## 2018-08-30 NOTE — Progress Notes (Signed)
   PRENATAL VISIT NOTE  Subjective:  Amy [name] is a 38 y.o. [phone] at 102w2d being seen today for ongoing prenatal care.  She is currently monitored for the following issues for this high-risk pregnancy and has OBESITY; MIGRAINE, COMMON; CONSTIPATION; HSV-2 (herpes simplex virus 2) infection; Supervision of high risk pregnancy, antepartum; AMA (advanced maternal age) multigravida 35+; and History of cesarean delivery on their problem list.  Patient reports no complaints.  Contractions: Not present. Vag. Bleeding: None.  Movement: Present. Denies leaking of fluid.   The following portions of the patient's history were reviewed and updated as appropriate: allergies, current medications, past family history, past medical history, past social history, past surgical history and problem list. Problem list updated.  Objective:   Vitals:   08/30/18 0917  BP: 137/75  Pulse: 89  Weight: 198 lb 11.2 oz (90.1 kg)    Fetal Status: Fetal Heart Rate (bpm): 149   Movement: Present     General:  Alert, oriented and cooperative. Patient is in no acute distress.  Skin: Skin is warm and dry. No rash noted.   Cardiovascular: Normal heart rate noted  Respiratory: Normal respiratory effort, no problems with respiration noted  Abdomen: Soft, gravid, appropriate for gestational age.  Pain/Pressure: Absent     Pelvic: Cervical exam deferred        Extremities: Normal range of motion.  Edema: None  Mental Status: Normal mood and affect. Normal behavior. Normal judgment and thought content.   Assessment and Plan:  Pregnancy: [id number] at [redacted]w[redacted]d  1. Multigravida of advanced maternal age in first trimester NIPS ordered today.  AFP screen next visit. - Genetic Screening - Obstetric Panel, Including HIV - [place] MFM OB DETAIL +14 WK; Future  2. Encounter for fetal anatomic survey Anatomy scan already scheduled - [place] MFM OB DETAIL +14 WK; Future  3. Supervision of high risk pregnancy, antepartum No other  complaints or concerns.  Routine obstetric precautions reviewed. Please refer to After Visit Summary for other counseling recommendations.  Return in about 4 weeks (around 09/27/2018) for OB Visit (HOB).  Future Appointments  Date Time Provider Department Center  10/09/2018  9:00 AM [facility] [place] 3 [facility]    Jaynie Collins, MD

## 2018-08-30 NOTE — Patient Instructions (Signed)
Return to office for any scheduled appointments. Call the office or go to the MAU at [facility] if:  You begin to have strong, frequent contractions  Your water breaks.  Sometimes it is a big gush of fluid, sometimes it is just a trickle that keeps getting your panties wet or running down your legs  You have vaginal bleeding.  It is normal to have a small amount of spotting if your cervix was checked.   Any other obstetric concerns.   

## 2018-09-01 LAB — OBSTETRIC PANEL, INCLUDING HIV
Antibody Screen: NEGATIVE
Basophils Absolute: 0 10*3/uL (ref 0.0–0.2)
Basos: 0 %
EOS (ABSOLUTE): 0.1 10*3/uL (ref 0.0–0.4)
Eos: 1 %
HIV Screen 4th Generation wRfx: NONREACTIVE
Hematocrit: 37.4 % (ref 34.0–46.6)
Hemoglobin: 13.1 g/dL (ref 11.1–15.9)
Hepatitis B Surface Ag: NEGATIVE
Immature Grans (Abs): 0.1 10*3/uL (ref 0.0–0.1)
Immature Granulocytes: 1 %
LYMPHS ABS: 1.5 10*3/uL (ref 0.7–3.1)
Lymphs: 16 %
MCH: 31.3 pg (ref 26.6–33.0)
MCHC: 35 g/dL (ref 31.5–35.7)
MCV: 89 fL (ref 79–97)
Monocytes Absolute: 0.7 10*3/uL (ref 0.1–0.9)
Monocytes: 7 %
NEUTROS PCT: 75 %
Neutrophils Absolute: 6.7 10*3/uL (ref 1.4–7.0)
Platelets: 200 10*3/uL (ref 150–450)
RBC: 4.19 x10E6/uL (ref 3.77–5.28)
RDW: 12.8 % (ref 11.7–15.4)
RPR Ser Ql: NONREACTIVE
Rh Factor: POSITIVE
Rubella Antibodies, IGG: 1 index (ref >0.99)
WBC: 9 10*3/uL (ref 3.4–10.8)

## 2018-09-10 ENCOUNTER — Encounter: Payer: Self-pay | Admitting: *Deleted

## 2018-09-16 ENCOUNTER — Encounter: Payer: Self-pay | Admitting: *Deleted

## 2018-09-26 ENCOUNTER — Telehealth: Payer: Self-pay | Admitting: Family Medicine

## 2018-09-26 NOTE — Telephone Encounter (Signed)
postpone--get cuff, ? Babyrx, virtual in 5 wks, inperson in 9 with 28 wk labs--Return in about 4 weeks (around 09/27/2018) for OB Visit (HOB). //ec-- Per Dr. Shawnie Pons  **Placing the patient on the wait list as the schedule is not available for May. Called the patient to inform of the cuff and baby scripts.  Left the patient a detailed message regarding the cancellation of the appointment due to COVID19.

## 2018-09-27 ENCOUNTER — Encounter: Payer: Medicaid Other | Admitting: Obstetrics and Gynecology

## 2018-10-09 ENCOUNTER — Other Ambulatory Visit: Payer: Self-pay

## 2018-10-09 ENCOUNTER — Ambulatory Visit (HOSPITAL_COMMUNITY): Payer: Medicaid Other | Admitting: *Deleted

## 2018-10-09 ENCOUNTER — Encounter (HOSPITAL_COMMUNITY): Payer: Self-pay

## 2018-10-09 ENCOUNTER — Ambulatory Visit (HOSPITAL_COMMUNITY)
Admission: RE | Admit: 2018-10-09 | Discharge: 2018-10-09 | Disposition: A | Discharge status: Home / Self Care | Payer: Medicaid Other | Source: Ambulatory Visit | Attending: Obstetrics & Gynecology | Admitting: Obstetrics & Gynecology

## 2018-10-09 VITALS — Temp 98.6°F

## 2018-10-09 DIAGNOSIS — Z363 Encounter for antenatal screening for malformations: Secondary | ICD-10-CM

## 2018-10-09 DIAGNOSIS — O34219 Maternal care for unspecified type scar from previous cesarean delivery: Secondary | ICD-10-CM

## 2018-10-09 DIAGNOSIS — Z3A19 19 weeks gestation of pregnancy: Secondary | ICD-10-CM | POA: Diagnosis not present

## 2018-10-09 DIAGNOSIS — O09521 Supervision of elderly multigravida, first trimester: Secondary | ICD-10-CM | POA: Diagnosis not present

## 2018-10-09 DIAGNOSIS — O09522 Supervision of elderly multigravida, second trimester: Secondary | ICD-10-CM | POA: Diagnosis not present

## 2018-10-09 DIAGNOSIS — Z3689 Encounter for other specified antenatal screening: Secondary | ICD-10-CM | POA: Diagnosis present

## 2018-10-09 DIAGNOSIS — O09523 Supervision of elderly multigravida, third trimester: Secondary | ICD-10-CM | POA: Insufficient documentation

## 2018-10-10 ENCOUNTER — Other Ambulatory Visit (HOSPITAL_COMMUNITY): Payer: Self-pay | Admitting: *Deleted

## 2018-10-10 DIAGNOSIS — O09522 Supervision of elderly multigravida, second trimester: Secondary | ICD-10-CM

## 2018-11-06 ENCOUNTER — Other Ambulatory Visit: Payer: Self-pay

## 2018-11-06 ENCOUNTER — Ambulatory Visit (HOSPITAL_COMMUNITY)
Admission: RE | Admit: 2018-11-06 | Discharge: 2018-11-06 | Disposition: A | Discharge status: Home / Self Care | Payer: Medicaid Other | Source: Ambulatory Visit | Attending: Obstetrics and Gynecology | Admitting: Obstetrics and Gynecology

## 2018-11-06 DIAGNOSIS — O09522 Supervision of elderly multigravida, second trimester: Secondary | ICD-10-CM | POA: Insufficient documentation

## 2018-11-06 DIAGNOSIS — O34219 Maternal care for unspecified type scar from previous cesarean delivery: Secondary | ICD-10-CM

## 2018-11-06 DIAGNOSIS — Z3A23 23 weeks gestation of pregnancy: Secondary | ICD-10-CM

## 2018-11-06 DIAGNOSIS — Z362 Encounter for other antenatal screening follow-up: Secondary | ICD-10-CM | POA: Diagnosis not present

## 2018-11-08 ENCOUNTER — Telehealth (INDEPENDENT_AMBULATORY_CARE_PROVIDER_SITE_OTHER): Payer: Medicaid Other | Admitting: Obstetrics & Gynecology

## 2018-11-08 ENCOUNTER — Telehealth: Payer: Self-pay | Admitting: Obstetrics & Gynecology

## 2018-11-08 ENCOUNTER — Other Ambulatory Visit: Payer: Self-pay

## 2018-11-08 DIAGNOSIS — Z98891 History of uterine scar from previous surgery: Secondary | ICD-10-CM

## 2018-11-08 DIAGNOSIS — O0992 Supervision of high risk pregnancy, unspecified, second trimester: Secondary | ICD-10-CM

## 2018-11-08 DIAGNOSIS — B009 Herpesviral infection, unspecified: Secondary | ICD-10-CM

## 2018-11-08 DIAGNOSIS — Z3A23 23 weeks gestation of pregnancy: Secondary | ICD-10-CM

## 2018-11-08 DIAGNOSIS — O09521 Supervision of elderly multigravida, first trimester: Secondary | ICD-10-CM

## 2018-11-08 DIAGNOSIS — O09522 Supervision of elderly multigravida, second trimester: Secondary | ICD-10-CM

## 2018-11-08 DIAGNOSIS — O0990 Supervision of high risk pregnancy, unspecified, unspecified trimester: Secondary | ICD-10-CM

## 2018-11-08 MED ORDER — VALACYCLOVIR HCL 500 MG PO TABS: 500.0000 mg | ORAL_TABLET | Freq: Two times a day (BID) | ORAL | 6 refills | Status: DC

## 2018-11-08 MED ORDER — AMBULATORY NON FORMULARY MEDICATION: 1.0000 | 0 refills | Status: DC

## 2018-11-08 NOTE — Progress Notes (Signed)
1013: Called pt regarding her appointment.  Pt did not pick up.  Left voicemail advising pt that she was being contacted regarding her appointment and requesting she call the clinic.  Will attempt to contact again in 15 minutes.

## 2018-11-08 NOTE — Progress Notes (Signed)
   TELEHEALTH VIRTUAL OBSTETRICS PRENATAL VISIT ENCOUNTER NOTE  I connected with Amy [name] on 11/08/18 at 10:15 AM EDT by WebEx at home and verified that I am speaking with the correct person using two identifiers.   I discussed the limitations, risks, security and privacy concerns of performing an evaluation and management service by telephone and the availability of in person appointments. I also discussed with the patient that there may be a patient responsible charge related to this service. The patient expressed understanding and agreed to proceed. Subjective:  Amy [name] is a 38 y.o. [id number] at [redacted]w[redacted]d being seen today for ongoing prenatal care.  She is currently monitored for the following issues for this high-risk pregnancy and has OBESITY; MIGRAINE, COMMON; CONSTIPATION; HSV-2 (herpes simplex virus 2) infection; Supervision of high risk pregnancy, antepartum; AMA (advanced maternal age) multigravida 35+; and History of cesarean delivery on their problem list.  Patient reports no complaints.  Reports fetal movement. Contractions: Not present. Vag. Bleeding: None.  Movement: Present. Denies any contractions, bleeding or leaking of fluid.   The following portions of the patient's history were reviewed and updated as appropriate: allergies, current medications, past family history, past medical history, past social history, past surgical history and problem list.   Objective:  There were no vitals filed for this visit.  Fetal Status:     Movement: Present     General:  Alert, oriented and cooperative. Patient is in no acute distress.  Respiratory: Normal respiratory effort, no problems with respiration noted  Mental Status: Normal mood and affect. Normal behavior. Normal judgment and thought content.  Rest of physical exam deferred due to type of encounter  Assessment and Plan:  Pregnancy: [id number] at [redacted]w[redacted]d 1. Supervision of high risk pregnancy, antepartum  - [facility] AMB BABYSCRIPTS  SCHEDULE OPTIMIZATION  2. History of cesarean delivery - wants TOLAC  3. Multigravida of advanced maternal age in second trimester   5. HSV-2 (herpes simplex virus 2) infection - prophylaxis  Preterm labor symptoms and general obstetric precautions including but not limited to vaginal bleeding, contractions, leaking of fluid and fetal movement were reviewed in detail with the patient. I discussed the assessment and treatment plan with the patient. The patient was provided an opportunity to ask questions and all were answered. The patient agreed with the plan and demonstrated an understanding of the instructions. The patient was advised to call back or seek an in-person office evaluation/go to MAU at [facility] for any urgent or concerning symptoms. Please refer to After Visit Summary for other counseling recommendations.   I provided 10 minutes of face-to-face via WebEx time during this encounter.  Return in about 3 weeks (around 11/29/2018) for 2 hour GTT and labs.  No future appointments.  Allie Bossier, MD Center for [employer], [facility]

## 2018-11-08 NOTE — Addendum Note (Signed)
Addended by: Gwendlyn Deutscher A on: 11/08/2018 11:02 AM   Modules accepted: Orders

## 2018-11-08 NOTE — Telephone Encounter (Signed)
Attempted to call patient to inform her that her visit today 5/15 will be done through her mychart. No answer, left detailed message with this information and advised to give the office a call with any questions.

## 2018-12-13 DIAGNOSIS — O0990 Supervision of high risk pregnancy, unspecified, unspecified trimester: Secondary | ICD-10-CM

## 2018-12-13 DIAGNOSIS — J302 Other seasonal allergic rhinitis: Secondary | ICD-10-CM

## 2018-12-16 MED ORDER — PRENATAL PLUS 27-1 MG PO TABS: 1.0000 | ORAL_TABLET | Freq: Every day | ORAL | 0 refills | Status: DC

## 2018-12-16 MED ORDER — FLUTICASONE PROPIONATE 50 MCG/ACT NA SUSP: 2.0000 | Freq: Two times a day (BID) | NASAL | 0 refills | Status: DC | PRN

## 2018-12-17 ENCOUNTER — Telehealth: Payer: Self-pay | Admitting: Family Medicine

## 2018-12-17 NOTE — Telephone Encounter (Signed)
Attempted to call patient to ask her about any symptoms, and to wear her mask the whole visit covering her nose and mouth, and no visitors.  Also, to sanitize her hands upon arriving. A message was left on her voicemail.  °

## 2018-12-23 ENCOUNTER — Other Ambulatory Visit: Payer: Medicaid Other

## 2018-12-23 ENCOUNTER — Telehealth: Payer: Self-pay | Admitting: Obstetrics and Gynecology

## 2018-12-23 ENCOUNTER — Encounter: Payer: Medicaid Other | Admitting: Obstetrics and Gynecology

## 2018-12-23 NOTE — Telephone Encounter (Signed)
Patient stated she would need to reschedule her appointment because of her job she has to give them notice to request off.

## 2018-12-24 ENCOUNTER — Encounter: Payer: Medicaid Other | Admitting: Obstetrics and Gynecology

## 2018-12-25 ENCOUNTER — Telehealth: Payer: Self-pay | Admitting: Family Medicine

## 2018-12-25 ENCOUNTER — Other Ambulatory Visit: Payer: Self-pay | Admitting: *Deleted

## 2018-12-25 DIAGNOSIS — O0990 Supervision of high risk pregnancy, unspecified, unspecified trimester: Secondary | ICD-10-CM

## 2018-12-25 NOTE — Telephone Encounter (Signed)
Spoke with patient about her appointment on 7/2 @ 8:40. Patient was instructed to wear a face mask and no visitors are allowed with her. Patient was screened for covid symptoms and denied having any

## 2018-12-26 ENCOUNTER — Other Ambulatory Visit: Payer: Self-pay

## 2018-12-26 ENCOUNTER — Other Ambulatory Visit: Payer: Medicaid Other

## 2018-12-26 DIAGNOSIS — O0990 Supervision of high risk pregnancy, unspecified, unspecified trimester: Secondary | ICD-10-CM

## 2018-12-27 LAB — GLUCOSE TOLERANCE, 2 HOURS W/ 1HR
Glucose, 1 hour: 122 mg/dL (ref 65–179)
Glucose, 2 hour: 66 mg/dL (ref 65–152)
Glucose, Fasting: 78 mg/dL (ref 65–91)

## 2018-12-27 LAB — CBC
Hematocrit: 34.4 % (ref 34.0–46.6)
Hemoglobin: 11.6 g/dL (ref 11.1–15.9)
MCH: 30 pg (ref 26.6–33.0)
MCHC: 33.7 g/dL (ref 31.5–35.7)
MCV: 89 fL (ref 79–97)
Platelets: 174 10*3/uL (ref 150–450)
RBC: 3.87 x10E6/uL (ref 3.77–5.28)
RDW: 13.1 % (ref 11.7–15.4)
WBC: 9.7 10*3/uL (ref 3.4–10.8)

## 2018-12-27 LAB — RPR: RPR Ser Ql: NONREACTIVE

## 2018-12-27 LAB — HIV ANTIBODY (ROUTINE TESTING W REFLEX): HIV Screen 4th Generation wRfx: NONREACTIVE

## 2018-12-30 ENCOUNTER — Ambulatory Visit (INDEPENDENT_AMBULATORY_CARE_PROVIDER_SITE_OTHER): Payer: Medicaid Other | Admitting: Obstetrics & Gynecology

## 2018-12-30 ENCOUNTER — Encounter: Payer: Self-pay | Admitting: Obstetrics & Gynecology

## 2018-12-30 ENCOUNTER — Other Ambulatory Visit: Payer: Self-pay

## 2018-12-30 VITALS — BP 114/73 | HR 92 | Temp 97.7°F | Wt 236.2 lb

## 2018-12-30 DIAGNOSIS — Z98891 History of uterine scar from previous surgery: Secondary | ICD-10-CM

## 2018-12-30 DIAGNOSIS — O0993 Supervision of high risk pregnancy, unspecified, third trimester: Secondary | ICD-10-CM

## 2018-12-30 DIAGNOSIS — O0990 Supervision of high risk pregnancy, unspecified, unspecified trimester: Secondary | ICD-10-CM

## 2018-12-30 DIAGNOSIS — Z3A30 30 weeks gestation of pregnancy: Secondary | ICD-10-CM

## 2018-12-30 DIAGNOSIS — O09523 Supervision of elderly multigravida, third trimester: Secondary | ICD-10-CM

## 2018-12-30 MED ORDER — PANTOPRAZOLE SODIUM 20 MG PO TBEC: 20.0000 mg | DELAYED_RELEASE_TABLET | Freq: Every day | ORAL | 1 refills | Status: DC

## 2018-12-30 NOTE — Progress Notes (Signed)
   PRENATAL VISIT NOTE  Subjective:  Amy [name] is a 38 y.o. [phone] at [redacted]w[redacted]d being seen today for ongoing prenatal care.  She is currently monitored for the following issues for this high-risk pregnancy and has OBESITY; MIGRAINE, COMMON; CONSTIPATION; HSV-2 (herpes simplex virus 2) infection; Supervision of high risk pregnancy, antepartum; AMA (advanced maternal age) multigravida 35+; and History of cesarean delivery on their problem list.  Patient reports pressure.  Contractions: Not present. Vag. Bleeding: None.  Movement: Present. Denies leaking of fluid.   The following portions of the patient's history were reviewed and updated as appropriate: allergies, current medications, past family history, past medical history, past social history, past surgical history and problem list.   Objective:   Vitals:   12/30/18 1638  BP: 114/73  Pulse: 92  Temp: 97.7 F (36.5 C)  Weight: 236 lb 3.2 oz (107.1 kg)    Fetal Status: Fetal Heart Rate (bpm): 157   Movement: Present     General:  Alert, oriented and cooperative. Patient is in no acute distress.  Skin: Skin is warm and dry. No rash noted.   Cardiovascular: Normal heart rate noted  Respiratory: Normal respiratory effort, no problems with respiration noted  Abdomen: Soft, gravid, appropriate for gestational age.  Pain/Pressure: Present     Pelvic: Cervical exam deferred        Extremities: Normal range of motion.  Edema: None  Mental Status: Normal mood and affect. Normal behavior. Normal judgment and thought content.   Assessment and Plan:  Pregnancy: [id number] at [redacted]w[redacted]d 1. Supervision of high risk pregnancy, antepartum   2. Multigravida of advanced maternal age in third trimester   3. History of cesarean delivery Desires TOLAC, signed consent  Preterm labor symptoms and general obstetric precautions including but not limited to vaginal bleeding, contractions, leaking of fluid and fetal movement were reviewed in detail with  the patient. Please refer to After Visit Summary for other counseling recommendations.   Return in about 3 weeks (around 01/20/2019) for VIRTUAL.  No future appointments.  Emeterio Reeve, MD

## 2018-12-30 NOTE — Patient Instructions (Signed)
Vaginal Birth After Cesarean Delivery  Vaginal birth after cesarean delivery (VBAC) is giving birth vaginally after previously delivering a baby through a cesarean section (C-section). A VBAC may be a safe option for you, depending on your health and other factors. It is important to discuss VBAC with your health care provider early in your pregnancy so you can understand the risks, benefits, and options. Having these discussions early will give you time to make your birth plan. Who are the best candidates for VBAC? The best candidates for VBAC are women who:  Have had one or two prior cesarean deliveries, and the incision made during the delivery was horizontal (low transverse).  Do not have a vertical (classical) scar on their uterus.  Have not had a tear in the wall of their uterus (uterine rupture).  Plan to have more pregnancies. A VBAC is also more likely to be successful:  In women who have previously given birth vaginally.  When labor starts by itself (spontaneously) before the due date. What are the benefits of VBAC? The benefits of delivering your baby vaginally instead of by a cesarean delivery include:  A shorter hospital stay.  A faster recovery time.  Less pain.  Avoiding risks associated with major surgery, such as infection and blood clots.  Less blood loss and less need for donated blood (transfusions). What are the risks of VBAC? The main risk of attempting a VBAC is that it may fail, forcing your health care provider to deliver your baby by a C-section. Other risks are rare and include:  Tearing (rupture) of the scar from a past cesarean delivery.  Other risks associated with vaginal deliveries. If a repeat cesarean delivery is needed, the risks include:  Blood loss.  Infection.  Blood clot.  Damage to surrounding organs.  Removal of the uterus (hysterectomy), if it is damaged.  Placenta problems in future pregnancies. What else should I know  about my options? Delivering a baby through a VBAC is similar to having a normal spontaneous vaginal delivery. Therefore, it is safe:  To try with twins.  For your health care provider to try to turn the baby from a breech position (external cephalic version) during labor.  With epidural analgesia for pain relief. Consider where you would like to deliver your baby. VBAC should be attempted in facilities where an emergency cesarean delivery can be performed. VBAC is not recommended for home births. Any changes in your health or your baby's health during your pregnancy may make it necessary to change your initial decision about VBAC. Your health care provider may recommend that you do not attempt a VBAC if:  Your baby's suspected weight is 8.8 lb (4 kg) or more.  You have preeclampsia. This is a condition that causes high blood pressure along with other symptoms, such as swelling and headaches.  You will have VBAC less than 19 months after your cesarean delivery.  You are past your due date.  You need to have labor started (induced) because your cervix is not ready for labor (unfavorable). Where to find more information  American Pregnancy Association: americanpregnancy.org  American Congress of Obstetricians and Gynecologists: acog.org Summary  Vaginal birth after cesarean delivery (VBAC) is giving birth vaginally after previously delivering a baby through a cesarean section (C-section). A VBAC may be a safe option for you, depending on your health and other factors.  Discuss VBAC with your health care provider early in your pregnancy so you can understand the risks, benefits, options, and   have plenty of time to make your birth plan.  The main risk of attempting a VBAC is that it may fail, forcing your health care provider to deliver your baby by a C-section. Other risks are rare. This information is not intended to replace advice given to you by your health care provider. Make sure  you discuss any questions you have with your health care provider. Document Released: 12/03/2006 Document Revised: 10/08/2018 Document Reviewed: 09/19/2016 Elsevier Patient Education  2020 Elsevier Inc.  

## 2018-12-30 NOTE — Progress Notes (Signed)
Pt states is having pain in outer labia on the left side

## 2018-12-31 ENCOUNTER — Encounter: Payer: Self-pay | Admitting: *Deleted

## 2019-01-20 ENCOUNTER — Other Ambulatory Visit: Payer: Self-pay

## 2019-01-20 ENCOUNTER — Telehealth (INDEPENDENT_AMBULATORY_CARE_PROVIDER_SITE_OTHER): Payer: Medicaid Other | Admitting: Obstetrics & Gynecology

## 2019-01-20 VITALS — BP 122/80 | HR 98 | Wt 236.0 lb

## 2019-01-20 DIAGNOSIS — Z98891 History of uterine scar from previous surgery: Secondary | ICD-10-CM

## 2019-01-20 DIAGNOSIS — O09523 Supervision of elderly multigravida, third trimester: Secondary | ICD-10-CM

## 2019-01-20 DIAGNOSIS — O98513 Other viral diseases complicating pregnancy, third trimester: Secondary | ICD-10-CM

## 2019-01-20 DIAGNOSIS — F439 Reaction to severe stress, unspecified: Secondary | ICD-10-CM

## 2019-01-20 DIAGNOSIS — B009 Herpesviral infection, unspecified: Secondary | ICD-10-CM

## 2019-01-20 DIAGNOSIS — O0990 Supervision of high risk pregnancy, unspecified, unspecified trimester: Secondary | ICD-10-CM

## 2019-01-20 DIAGNOSIS — Z3A33 33 weeks gestation of pregnancy: Secondary | ICD-10-CM

## 2019-01-20 NOTE — Progress Notes (Signed)
   PRENATAL VISIT NOTE  Subjective:  Amy [name] is a 38 y.o. [phone] at [redacted]w[redacted]d being seen today for ongoing prenatal care.  She is currently monitored for the following issues for this high-risk pregnancy and has OBESITY; MIGRAINE, COMMON; CONSTIPATION; HSV-2 (herpes simplex virus 2) infection; Supervision of high risk pregnancy, antepartum; AMA (advanced maternal age) multigravida 35+; and History of cesarean delivery on their problem list.  Patient reports has a herpes outbreak now, starting to take the valtrex as prescribed. Says that the prilosec is helpful.  She is very stressed.  Contractions: Not present. Vag. Bleeding: None.  Movement: Present. Denies leaking of fluid.   The following portions of the patient's history were reviewed and updated as appropriate: allergies, current medications, past family history, past medical history, past social history, past surgical history and problem list.   Objective:   Vitals:   01/20/19 1620 01/20/19 1627  BP: 140/74 122/80  Pulse:  98    Fetal Status:     Movement: Present     General:  Alert, oriented and cooperative. Patient is in no acute distress.  Skin: Skin is warm and dry. No rash noted.   Cardiovascular: Normal heart rate noted  Respiratory: Normal respiratory effort, no problems with respiration noted               Assessment and Plan:  Pregnancy: [id number] at [redacted]w[redacted]d 1. HSV-2 (herpes simplex virus 2) infection - on valtrex  2. Multigravida of advanced maternal age in third trimester - low risk NIPS  3. History of cesarean delivery - plans TOLAC, papers signed  4. Supervision of high risk pregnancy, antepartum  5. Stress- refer to [facility]  Preterm labor symptoms and general obstetric precautions including but not limited to vaginal bleeding, contractions, leaking of fluid and fetal movement were reviewed in detail with the patient. Please refer to After Visit Summary for other counseling recommendations.   Return in  about 2 weeks (around 02/03/2019) for in person, cervical cultures.  Future Appointments  Date Time Provider [place]  02/05/2019  4:15 PM Virginia Rochester, NP [facility] [place]  02/12/2019  4:15 PM Luvenia Redden, PA-C [facility]  02/19/2019  4:15 PM Hillard Danker, Myles Rosenthal, PA-C [facility]    Emily Filbert, MD

## 2019-02-04 ENCOUNTER — Other Ambulatory Visit: Payer: Self-pay | Admitting: General Practice

## 2019-02-04 DIAGNOSIS — O0990 Supervision of high risk pregnancy, unspecified, unspecified trimester: Secondary | ICD-10-CM

## 2019-02-04 MED ORDER — PRENATAL PLUS 27-1 MG PO TABS: 1.0000 | ORAL_TABLET | Freq: Every day | ORAL | 11 refills | Status: AC

## 2019-02-05 ENCOUNTER — Ambulatory Visit (INDEPENDENT_AMBULATORY_CARE_PROVIDER_SITE_OTHER): Payer: Medicaid Other | Admitting: Nurse Practitioner

## 2019-02-05 ENCOUNTER — Other Ambulatory Visit: Payer: Self-pay

## 2019-02-05 ENCOUNTER — Encounter: Payer: Self-pay | Admitting: Nurse Practitioner

## 2019-02-05 ENCOUNTER — Other Ambulatory Visit (HOSPITAL_COMMUNITY)
Admission: RE | Admit: 2019-02-05 | Discharge: 2019-02-05 | Disposition: A | Discharge status: Home / Self Care | Payer: Medicaid Other | Source: Ambulatory Visit | Attending: Nurse Practitioner | Admitting: Nurse Practitioner

## 2019-02-05 VITALS — BP 126/80 | HR 97 | Temp 98.1°F | Wt 247.5 lb

## 2019-02-05 DIAGNOSIS — O0990 Supervision of high risk pregnancy, unspecified, unspecified trimester: Secondary | ICD-10-CM

## 2019-02-05 DIAGNOSIS — Z3A36 36 weeks gestation of pregnancy: Secondary | ICD-10-CM

## 2019-02-05 DIAGNOSIS — O0993 Supervision of high risk pregnancy, unspecified, third trimester: Secondary | ICD-10-CM

## 2019-02-05 DIAGNOSIS — B372 Candidiasis of skin and nail: Secondary | ICD-10-CM | POA: Diagnosis not present

## 2019-02-05 LAB — OB RESULTS CONSOLE GBS: GBS: NEGATIVE

## 2019-02-05 LAB — OB RESULTS CONSOLE GC/CHLAMYDIA: Gonorrhea: NEGATIVE

## 2019-02-05 NOTE — Patient Instructions (Signed)

## 2019-02-05 NOTE — Progress Notes (Signed)
Pt states has been having breakouts under Breast

## 2019-02-06 MED ORDER — TERCONAZOLE 0.4 % VA CREA: TOPICAL_CREAM | VAGINAL | 0 refills | Status: DC

## 2019-02-06 NOTE — Progress Notes (Signed)
    Subjective:  CINA KLUMPP is a 38 y.o. [id number] at [redacted]w[redacted]d being seen today for ongoing prenatal care.  She is currently monitored for the following issues for this high-risk pregnancy and has OBESITY; MIGRAINE, COMMON; CONSTIPATION; HSV-2 (herpes simplex virus 2) infection; Supervision of high risk pregnancy, antepartum; AMA (advanced maternal age) multigravida 35+; and History of cesarean delivery on their problem list.  Patient reports rash under and between breasts.  Thinking she was developing moles.  .  Contractions: Not present. Vag. Bleeding: None.  Movement: Present. Denies leaking of fluid.   The following portions of the patient's history were reviewed and updated as appropriate: allergies, current medications, past family history, past medical history, past social history, past surgical history and problem list. Problem list updated.  Objective:   Vitals:   02/05/19 1631  BP: 126/80  Pulse: 97  Temp: 98.1 F (36.7 C)  Weight: 247 lb 8 oz (112.3 kg)    Fetal Status: Fetal Heart Rate (bpm):  139 Fundal Height: 36 cm Movement: Present  Presentation: Vertex  General:  Alert, oriented and cooperative. Patient is in no acute distress.  Skin: Skin is warm and dry. No rash noted.   Cardiovascular: Normal heart rate noted  Respiratory: Normal respiratory effort, no problems with respiration noted  Abdomen: Soft, gravid, appropriate for gestational age. Pain/Pressure: Absent     Pelvic:  Cervical exam performed Dilation: Fingertip   Station: -3  GC/GBS swabs done  Extremities: Normal range of motion.  Edema: Moderate pitting, indentation subsides rapidly  Mental Status: Normal mood and affect. Normal behavior. Normal judgment and thought content.  Skin under breasts very red and smooth - has used Gold Bond cream.  Between breasts there are several normopigmented raised areas.  Urinalysis:      Assessment and Plan:  Pregnancy: [id number] at [redacted]w[redacted]d  1.  Supervision of high risk  pregnancy, antepartum Doing well - baby moving, irregular contractions Reviewed taking BP and warning signs in late pregnancy for BP as ankles and lower legs are swollen  - GC/Chlamydia probe amp ([place])not at [facility] - Culture, beta strep (group b only)  2. Yeast dermatitis Prescribed Terazol for external use under breasts. Thinking the raised areas are from irritation and yeast but will use cream and reevaluate in a few weeks.  Preterm labor symptoms and general obstetric precautions including but not limited to vaginal bleeding, contractions, leaking of fluid and fetal movement were reviewed in detail with the patient. Please refer to After Visit Summary for other counseling recommendations.  Return in about 1 week (around 02/12/2019) for video visit.  Already scheduled on 02-12-19  Earlie Server, RN, MSN, NP-BC Nurse Practitioner, [facility] for [employer], [place] [facility] 02/05/19  5:00 PM

## 2019-02-07 LAB — GC/CHLAMYDIA PROBE AMP (~~LOC~~) NOT AT ARMC
Chlamydia: NEGATIVE
Neisseria Gonorrhea: NEGATIVE

## 2019-02-09 ENCOUNTER — Ambulatory Visit (INDEPENDENT_AMBULATORY_CARE_PROVIDER_SITE_OTHER)
Admission: RE | Admit: 2019-02-09 | Discharge: 2019-02-09 | Disposition: A | Discharge status: Home / Self Care | Payer: Medicaid Other | Source: Ambulatory Visit

## 2019-02-09 DIAGNOSIS — M25512 Pain in left shoulder: Secondary | ICD-10-CM | POA: Diagnosis not present

## 2019-02-09 DIAGNOSIS — R519 Headache, unspecified: Secondary | ICD-10-CM

## 2019-02-09 DIAGNOSIS — R51 Headache: Secondary | ICD-10-CM

## 2019-02-09 DIAGNOSIS — Z3493 Encounter for supervision of normal pregnancy, unspecified, third trimester: Secondary | ICD-10-CM | POA: Diagnosis not present

## 2019-02-09 LAB — CULTURE, BETA STREP (GROUP B ONLY): Strep Gp B Culture: NEGATIVE

## 2019-02-09 MED ORDER — CYCLOBENZAPRINE HCL 5 MG PO TABS: 5.0000 mg | ORAL_TABLET | Freq: Two times a day (BID) | ORAL | 0 refills | Status: DC | PRN

## 2019-02-09 NOTE — ED Provider Notes (Signed)
Virtual Visit via Video Note:  Amy [name]  initiated request for Telemedicine visit with [facility] team. I connected with Amy [name]  on 02/09/2019 at 10:32 AM  for a synchronized telemedicine visit using a video enabled HIPPA compliant telemedicine application. I verified that I am speaking with Amy [name]  using two identifiers. Jaynee Eagles, PA-C  was physically located in a [facility] Urgent care site and Amy [name] was located at a different location.   The limitations of evaluation and management by telemedicine as well as the availability of in-person appointments were discussed. Patient was informed that she  may incur a bill ( including co-pay) for this virtual visit encounter. Amy [name]  expressed understanding and gave verbal consent to proceed with virtual visit.    History of Present Illness:Amy [name]  is a 38 y.o. female presents with 4 day hx of left upper back/shoulder pain, headache. Patient takes Protonix, prenatal vitamin, saline spray for sinuses. She did a complete nasal flush and this is helped some.  Also uses Flonase for allergies.  He is not regularly taking antihistamine.  She did have a visit with her obstetrician on 02/05/2019, did not have the symptoms then.  Blood pressure was completely normal at the time.  She did take some Tylenol for her left shoulder pain but has not noticed too much of a difference.  ROS Denies fever, confusion, dizziness, weakness, chest pain, belly pain.   No current facility-administered medications for this encounter.    Current Outpatient Medications  Medication Sig Dispense Refill  . acetaminophen (TYLENOL) 500 MG tablet Take 500 mg by mouth every 6 (six) hours as needed.    . AMBULATORY NON FORMULARY MEDICATION 1 Device by Other route once a week. Blood pressure cuff/medium/Monitored regularly at home/ ICD 10: HROB O09.90 1 kit 0  . fluticasone (FLONASE) 50 MCG/ACT nasal spray Place 2 sprays into  both nostrils 2 (two) times daily as needed for allergies or rhinitis. 9.9 mL 0  . Fluticasone Propionate (FLONASE NA) Place into the nose.    . pantoprazole (PROTONIX) 20 MG tablet Take 1 tablet (20 mg total) by mouth daily. 30 tablet 1  . prenatal vitamin w/FE, FA (PRENATAL 1 + 1) 27-1 MG TABS tablet Take 1 tablet by mouth daily at 12 noon. 30 tablet 11  . sodium chloride (OCEAN) 0.65 % SOLN nasal spray Place 1-2 sprays into both nostrils as needed for congestion. Reported on 10/07/2015    . terconazole (TERAZOL 7) 0.4 % vaginal cream Use on affected area BID for 7 days. 45 g 0  . valACYclovir (VALTREX) 500 MG tablet Take 1 tablet (500 mg total) by mouth 2 (two) times daily. 30 tablet 6     Allergies  Allergen Reactions  . Shrimp [Shellfish Allergy] Swelling    Swollen Lips     Past Medical History:  Diagnosis Date  . HSV infection   . Migraine   . Vaginal Pap smear, abnormal     Past Surgical History:  Procedure Laterality Date  . CESAREAN SECTION    . DILATION AND EVACUATION N/A 09/21/2015   Procedure: DILATATION AND EVACUATION;  Surgeon: Donnamae Jude, MD;  Location: [facility];  Service: Gynecology;  Laterality: N/A;  . THERAPEUTIC ABORTION    . WISDOM TOOTH EXTRACTION        Observations/Objective: Physical Exam Constitutional:      General: She is not in acute distress.  Appearance: Normal appearance. She is well-developed. She is not ill-appearing, toxic-appearing or diaphoretic.  Eyes:     Extraocular Movements: Extraocular movements intact.  Pulmonary:     Effort: Pulmonary effort is normal.  Neurological:     General: No focal deficit present.     Mental Status: She is alert and oriented to person, place, and time.  Psychiatric:        Mood and Affect: Mood normal.        Behavior: Behavior normal.        Thought Content: Thought content normal.        Judgment: Judgment normal.      Assessment and Plan:  1. Acute pain of left shoulder   2. Sinus  headache   3. Third trimester pregnancy    Recommended conservative management including adding Claritin to her Flonase.  She is going to monitor her blood pressure and contact her OB if systolic blood pressure goes above 140.  For her shoulder pain, counseled that we can manage it for musculoskeletal type pain with Tylenol and Flexeril.  Recommended patient follow-up with OB as soon as possible. Counseled patient on potential for adverse effects with medications prescribed/recommended today, ER and return-to-clinic precautions discussed, patient verbalized understanding.    Follow Up Instructions:    I discussed the assessment and treatment plan with the patient. The patient was provided an opportunity to ask questions and all were answered. The patient agreed with the plan and demonstrated an understanding of the instructions.   The patient was advised to call back or seek an in-person evaluation if the symptoms worsen or if the condition fails to improve as anticipated.  I provided 15 minutes of non-face-to-face time during this encounter.    Jaynee Eagles, PA-C  02/09/2019 10:32 AM         Jaynee Eagles, PA-C 02/09/19 1049

## 2019-02-11 ENCOUNTER — Other Ambulatory Visit: Payer: Self-pay

## 2019-02-11 ENCOUNTER — Telehealth (INDEPENDENT_AMBULATORY_CARE_PROVIDER_SITE_OTHER): Payer: Medicaid Other

## 2019-02-11 VITALS — BP 130/91 | HR 87

## 2019-02-11 DIAGNOSIS — O133 Gestational [pregnancy-induced] hypertension without significant proteinuria, third trimester: Secondary | ICD-10-CM | POA: Diagnosis not present

## 2019-02-11 DIAGNOSIS — O0993 Supervision of high risk pregnancy, unspecified, third trimester: Secondary | ICD-10-CM

## 2019-02-11 DIAGNOSIS — Z3A36 36 weeks gestation of pregnancy: Secondary | ICD-10-CM

## 2019-02-11 DIAGNOSIS — Z98891 History of uterine scar from previous surgery: Secondary | ICD-10-CM

## 2019-02-11 DIAGNOSIS — O0990 Supervision of high risk pregnancy, unspecified, unspecified trimester: Secondary | ICD-10-CM

## 2019-02-11 DIAGNOSIS — O09523 Supervision of elderly multigravida, third trimester: Secondary | ICD-10-CM | POA: Diagnosis not present

## 2019-02-11 DIAGNOSIS — O139 Gestational [pregnancy-induced] hypertension without significant proteinuria, unspecified trimester: Secondary | ICD-10-CM | POA: Insufficient documentation

## 2019-02-11 NOTE — Progress Notes (Signed)
   TELEHEALTH VIRTUAL OBSTETRICS VISIT ENCOUNTER NOTE  I connected with Amy [name] on 02/11/19 at  1:35 PM EDT by MyChart virtual visit at home and verified that I am speaking with the correct person using two identifiers.   I discussed the limitations, risks, security and privacy concerns of performing an evaluation and management service by telephone and the availability of in person appointments. I also discussed with the patient that there may be a patient responsible charge related to this service. The patient expressed understanding and agreed to proceed.  Subjective:  Amy [name] is a 38 y.o. [id number] at [redacted]w[redacted]d being followed for ongoing prenatal care.  She is currently monitored for the following issues for this high-risk pregnancy and has OBESITY; MIGRAINE, COMMON; CONSTIPATION; HSV-2 (herpes simplex virus 2) infection; Supervision of high risk pregnancy, antepartum; AMA (advanced maternal age) multigravida 35+; History of cesarean delivery; and Gestational hypertension on their problem list.  Patient reports no complaints. Reports fetal movement. Denies any contractions, bleeding or leaking of fluid.   The following portions of the patient's history were reviewed and updated as appropriate: allergies, current medications, past family history, past medical history, past social history, past surgical history and problem list.   Objective:   General:  Alert, oriented and cooperative.   Mental Status: Normal mood and affect perceived. Normal judgment and thought content.  Rest of physical exam deferred due to type of encounter  Assessment and Plan:  Pregnancy: L3Y1017 at [redacted]w[redacted]d 1. Supervision of high risk pregnancy, antepartum -BP 130/91. Denies any headache, visual changes or epigastric pain  2. Multigravida of advanced maternal age in third trimester   3. History of cesarean delivery -Planning TOLAC  4. Gestational hypertension, third trimester -BPs elevated both at 33w  and today. Meets criteria for gHTN. Will induce at 37w. Scheduled for 8/19, orders placed.  Preterm labor symptoms and general obstetric precautions including but not limited to vaginal bleeding, contractions, leaking of fluid and fetal movement were reviewed in detail with the patient.  I discussed the assessment and treatment plan with the patient. The patient was provided an opportunity to ask questions and all were answered. The patient agreed with the plan and demonstrated an understanding of the instructions. The patient was advised to call back or seek an in-person office evaluation/go to MAU at [facility] for any urgent or concerning symptoms. Please refer to After Visit Summary for other counseling recommendations.   I provided 20 minutes of non-face-to-face time during this encounter.  Return in about 1 week (around 02/18/2019) for Return OB visit.  Future Appointments  Date Time Provider [place]  02/19/2019  4:15 PM Luvenia Redden, PA-C [facility] [place], [address] for [employer], [place]

## 2019-02-11 NOTE — Progress Notes (Signed)
I connected with  Amy [name] on 02/11/19 at  1:35 PM EDT by telephone and verified that I am speaking with the correct person using two identifiers.   I discussed the limitations, risks, security and privacy concerns of performing an evaluation and management service by telephone and the availability of in person appointments. I also discussed with the patient that there may be a patient responsible charge related to this service. The patient expressed understanding and agreed to proceed.  [place], [place] 02/11/2019  1:29 PM

## 2019-02-12 ENCOUNTER — Telehealth: Payer: Medicaid Other | Admitting: Medical

## 2019-02-12 ENCOUNTER — Inpatient Hospital Stay (HOSPITAL_COMMUNITY)
Admission: AD | Admit: 2019-02-12 | Discharge: 2019-02-15 | DRG: 806 | Disposition: A | Discharge status: Home / Self Care | Payer: Medicaid Other | Attending: Obstetrics and Gynecology | Admitting: Obstetrics and Gynecology

## 2019-02-12 ENCOUNTER — Encounter (HOSPITAL_COMMUNITY): Payer: Self-pay | Admitting: Obstetrics

## 2019-02-12 DIAGNOSIS — A6000 Herpesviral infection of urogenital system, unspecified: Secondary | ICD-10-CM | POA: Diagnosis present

## 2019-02-12 DIAGNOSIS — Z87891 Personal history of nicotine dependence: Secondary | ICD-10-CM

## 2019-02-12 DIAGNOSIS — O34219 Maternal care for unspecified type scar from previous cesarean delivery: Secondary | ICD-10-CM | POA: Diagnosis present

## 2019-02-12 DIAGNOSIS — O134 Gestational [pregnancy-induced] hypertension without significant proteinuria, complicating childbirth: Secondary | ICD-10-CM | POA: Diagnosis present

## 2019-02-12 DIAGNOSIS — O139 Gestational [pregnancy-induced] hypertension without significant proteinuria, unspecified trimester: Secondary | ICD-10-CM | POA: Diagnosis present

## 2019-02-12 DIAGNOSIS — B009 Herpesviral infection, unspecified: Secondary | ICD-10-CM | POA: Diagnosis present

## 2019-02-12 DIAGNOSIS — O99214 Obesity complicating childbirth: Secondary | ICD-10-CM | POA: Diagnosis present

## 2019-02-12 DIAGNOSIS — O09523 Supervision of elderly multigravida, third trimester: Secondary | ICD-10-CM

## 2019-02-12 DIAGNOSIS — O9832 Other infections with a predominantly sexual mode of transmission complicating childbirth: Secondary | ICD-10-CM | POA: Diagnosis present

## 2019-02-12 DIAGNOSIS — O133 Gestational [pregnancy-induced] hypertension without significant proteinuria, third trimester: Secondary | ICD-10-CM

## 2019-02-12 DIAGNOSIS — O0990 Supervision of high risk pregnancy, unspecified, unspecified trimester: Secondary | ICD-10-CM

## 2019-02-12 DIAGNOSIS — Z3A37 37 weeks gestation of pregnancy: Secondary | ICD-10-CM

## 2019-02-12 DIAGNOSIS — O09529 Supervision of elderly multigravida, unspecified trimester: Secondary | ICD-10-CM

## 2019-02-12 DIAGNOSIS — E669 Obesity, unspecified: Secondary | ICD-10-CM | POA: Diagnosis present

## 2019-02-12 DIAGNOSIS — Z20828 Contact with and (suspected) exposure to other viral communicable diseases: Secondary | ICD-10-CM | POA: Diagnosis present

## 2019-02-12 DIAGNOSIS — Z98891 History of uterine scar from previous surgery: Secondary | ICD-10-CM

## 2019-02-12 LAB — COMPREHENSIVE METABOLIC PANEL
ALT: 17 U/L (ref 0–44)
AST: 21 U/L (ref 15–41)
Albumin: 2.9 g/dL — ABNORMAL LOW (ref 3.5–5.0)
Alkaline Phosphatase: 102 U/L (ref 38–126)
Anion gap: 9 (ref 5–15)
BUN: 11 mg/dL (ref 6–20)
CO2: 19 mmol/L — ABNORMAL LOW (ref 22–32)
Calcium: 8.9 mg/dL (ref 8.9–10.3)
Chloride: 107 mmol/L (ref 98–111)
Creatinine, Ser: 0.59 mg/dL (ref 0.44–1.00)
GFR calc Af Amer: >60 mL/min (ref >60)
GFR calc non Af Amer: >60 mL/min (ref >60)
Glucose, Bld: 80 mg/dL (ref 70–99)
Potassium: 3.9 mmol/L (ref 3.5–5.1)
Sodium: 135 mmol/L (ref 135–145)
Total Bilirubin: 0.4 mg/dL (ref 0.3–1.2)
Total Protein: 6.7 g/dL (ref 6.5–8.1)

## 2019-02-12 LAB — SARS CORONAVIRUS 2 BY RT PCR (HOSPITAL ORDER, PERFORMED IN ~~LOC~~ HOSPITAL LAB): SARS Coronavirus 2: NEGATIVE

## 2019-02-12 LAB — TYPE AND SCREEN
ABO/RH(D): O POS
Antibody Screen: NEGATIVE

## 2019-02-12 LAB — CBC
HCT: 35.3 % — ABNORMAL LOW (ref 36.0–46.0)
Hemoglobin: 11.9 g/dL — ABNORMAL LOW (ref 12.0–15.0)
MCH: 30.3 pg (ref 26.0–34.0)
MCHC: 33.7 g/dL (ref 30.0–36.0)
MCV: 89.8 fL (ref 80.0–100.0)
Platelets: 186 10*3/uL (ref 150–400)
RBC: 3.93 MIL/uL (ref 3.87–5.11)
RDW: 14.3 % (ref 11.5–15.5)
WBC: 7.4 10*3/uL (ref 4.0–10.5)
nRBC: 0 % (ref 0.0–0.2)

## 2019-02-12 LAB — PROTEIN / CREATININE RATIO, URINE
Creatinine, Urine: 190.4 mg/dL
Protein Creatinine Ratio: 0.07 mg/mg{Cre} (ref 0.00–0.15)
Total Protein, Urine: 14 mg/dL

## 2019-02-12 LAB — ABO/RH: ABO/RH(D): O POS

## 2019-02-12 MED ORDER — TERBUTALINE SULFATE 1 MG/ML IJ SOLN: 0.2500 mg | Freq: Once | INTRAMUSCULAR | Status: DC | PRN

## 2019-02-12 MED ORDER — OXYTOCIN 40 UNITS IN NORMAL SALINE INFUSION - SIMPLE MED
2.5000 [IU]/h | INTRAVENOUS | Status: DC
  Administered 2019-02-14: 2.5 [IU]/h via INTRAVENOUS

## 2019-02-12 MED ORDER — ACETAMINOPHEN 325 MG PO TABS
650.0000 mg | ORAL_TABLET | ORAL | Status: DC | PRN
  Administered 2019-02-13: 650 mg via ORAL
  Filled 2019-02-12: qty 2

## 2019-02-12 MED ORDER — ONDANSETRON HCL 4 MG/2ML IJ SOLN: 4.0000 mg | Freq: Four times a day (QID) | INTRAMUSCULAR | Status: DC | PRN

## 2019-02-12 MED ORDER — LACTATED RINGERS IV SOLN
INTRAVENOUS | Status: DC
  Administered 2019-02-12 – 2019-02-13 (×6): via INTRAVENOUS

## 2019-02-12 MED ORDER — LIDOCAINE HCL (PF) 1 % IJ SOLN: 30.0000 mL | INTRAMUSCULAR | Status: DC | PRN

## 2019-02-12 MED ORDER — LACTATED RINGERS IV SOLN: 500.0000 mL | INTRAVENOUS | Status: DC | PRN

## 2019-02-12 MED ORDER — FLEET ENEMA 7-19 GM/118ML RE ENEM: 1.0000 | ENEMA | RECTAL | Status: DC | PRN

## 2019-02-12 MED ORDER — OXYTOCIN BOLUS FROM INFUSION
500.0000 mL | Freq: Once | INTRAVENOUS | Status: AC
  Administered 2019-02-14: 500 mL via INTRAVENOUS

## 2019-02-12 MED ORDER — OXYCODONE-ACETAMINOPHEN 5-325 MG PO TABS: 1.0000 | ORAL_TABLET | ORAL | Status: DC | PRN

## 2019-02-12 MED ORDER — OXYTOCIN 40 UNITS IN NORMAL SALINE INFUSION - SIMPLE MED
1.0000 m[IU]/min | INTRAVENOUS | Status: DC
  Administered 2019-02-12: 8 m[IU]/min via INTRAVENOUS
  Administered 2019-02-12: 2 m[IU]/min via INTRAVENOUS
  Administered 2019-02-13: 4 m[IU]/min via INTRAVENOUS
  Filled 2019-02-12 (×2): qty 1000

## 2019-02-12 MED ORDER — OXYCODONE-ACETAMINOPHEN 5-325 MG PO TABS: 2.0000 | ORAL_TABLET | ORAL | Status: DC | PRN

## 2019-02-12 MED ORDER — SOD CITRATE-CITRIC ACID 500-334 MG/5ML PO SOLN: 30.0000 mL | ORAL | Status: DC | PRN

## 2019-02-12 NOTE — H&P (Signed)
LABOR AND DELIVERY ADMISSION HISTORY AND PHYSICAL NOTE  Amy [name] is a 38 y.o. female [phone] with IUP at 13w0dby first trimester sono presenting for IOL for gHTN.  She reports positive fetal movement. She denies leakage of fluid or vaginal bleeding. She denies headaches, vision changes and RUQ pain.   Prenatal History/Complications: [facility] Pregnancy complications:  - gHTN, no medications -TOLAC. Hx of vaginal delivery followed by CS for unstable lie.  -AMA  -HSV-2 on prophylaxis, no recent outbreaks or prodromal symptoms   Past Medical History: Past Medical History:  Diagnosis Date  . HSV infection   . Migraine   . Vaginal Pap smear, abnormal     Past Surgical History: Past Surgical History:  Procedure Laterality Date  . CESAREAN SECTION    . DILATION AND EVACUATION N/A 09/21/2015   Procedure: DILATATION AND EVACUATION;  Surgeon: TDonnamae Jude MD;  [facility];  Service: Gynecology;  Laterality: N/A;  . THERAPEUTIC ABORTION    . WISDOM TOOTH EXTRACTION      Obstetrical History: OB History    Gravida  8   Para  2   Term  2   Preterm      AB  5   Living  2     SAB  2   TAB  3   Ectopic      Multiple      Live Births              Social History: Social History   Socioeconomic History  . Marital status: Single    Spouse name: Not on file  . Number of children: Not on file  . Years of education: Not on file  . Highest education level: Not on file  Occupational History  . Not on file  Social Needs  . Financial resource strain: Not on file  . Food insecurity    Worry: Sometimes true    Inability: Never true  . Transportation needs    Medical: No    Non-medical: No  Tobacco Use  . Smoking status: Former Smoker    Packs/day: 0.50  . Smokeless tobacco: Current User  Substance and Sexual Activity  . Alcohol use: No  . Drug use: Never  . Sexual activity: Not Currently    Birth control/protection: None  Lifestyle  .  Physical activity    Days per week: Not on file    Minutes per session: Not on file  . Stress: Not on file  Relationships  . Social [place] phone: Not on file    Gets together: Not on file    Attends religious service: Not on file    Active member of club or organization: Not on file    Attends meetings of clubs or organizations: Not on file    Relationship status: Not on file  Other Topics Concern  . Not on file  Social History Narrative  . Not on file    Family History: Family History  Problem Relation Age of Onset  . Cancer Father   . Heart disease Maternal Aunt   . Stroke Maternal Grandmother   . Heart disease Maternal Grandfather     Allergies: Allergies  Allergen Reactions  . Shrimp [Shellfish Allergy] Swelling    Swollen Lips    Medications Prior to Admission  Medication Sig Dispense Refill Last Dose  . cyclobenzaprine (FLEXERIL) 5 MG tablet Take 1 tablet (5 mg total) by mouth 2 (  two) times daily as needed for muscle spasms. 30 tablet 0 02/11/2019 at Unknown time  . fluticasone (FLONASE) 50 MCG/ACT nasal spray Place 2 sprays into both nostrils 2 (two) times daily as needed for allergies or rhinitis. 9.9 mL 0 Past Week at Unknown time  . pantoprazole (PROTONIX) 20 MG tablet Take 1 tablet (20 mg total) by mouth daily. 30 tablet 1 02/11/2019 at Unknown time  . prenatal vitamin w/FE, FA (PRENATAL 1 + 1) 27-1 MG TABS tablet Take 1 tablet by mouth daily at 12 noon. 30 tablet 11 02/11/2019 at Unknown time  . valACYclovir (VALTREX) 500 MG tablet Take 1 tablet (500 mg total) by mouth 2 (two) times daily. 30 tablet 6 02/12/2019 at Unknown time  . acetaminophen (TYLENOL) 500 MG tablet Take 500 mg by mouth every 6 (six) hours as needed.     . AMBULATORY NON FORMULARY MEDICATION 1 Device by Other route once a week. Blood pressure cuff/medium/Monitored regularly at home/ ICD 10: HROB O09.90 1 kit 0   . Fluticasone Propionate (FLONASE NA) Place into the nose.     .  sodium chloride (OCEAN) 0.65 % SOLN nasal spray Place 1-2 sprays into both nostrils as needed for congestion. Reported on 10/07/2015     . terconazole (TERAZOL 7) 0.4 % vaginal cream Use on affected area BID for 7 days. 45 g 0      Review of Systems  All systems reviewed and negative except as stated in HPI  Physical Exam Blood pressure 137/80, pulse 91, last menstrual period 05/29/2018, unknown if currently breastfeeding. General appearance: alert, oriented, NAD Lungs: normal respiratory effort Heart: regular rate Abdomen: soft, non-tender; gravid, FH appropriate for GA Extremities: No calf swelling or tenderness GU: No herpetic lesions visualized.  Presentation: cephalic Fetal monitoring: 130 bpm, moderate variability, +acels, no decels  Uterine activity: Occasional  Dilation: Fingertip Effacement (%): Thick Exam by:: Dr Juleen China  Prenatal labs: ABO, Rh: --/--/O POS, O POS Performed at [facility], [place] [address] [place], [place] [address]  [phone]) Antibody: NEG (08/19 1032) Rubella: 1.00 (03/06 0942) RPR: Non Reactive (07/02 0911)  HBsAg: Negative (03/06 2641)  HIV: Non Reactive (07/02 0911)  GC/Chlamydia: Negative  GBS:   Negative  2-hr GTT: Normal  Genetic screening:  NIPs low risk  Anatomy US: Normal   Prenatal Transfer Tool  Maternal Diabetes: No Genetic Screening: Normal Maternal Ultrasounds/Referrals: Normal Fetal Ultrasounds or other Referrals:  None Maternal Substance Abuse:  No Significant Maternal Medications:  Meds include: Other: Valtrex  Significant Maternal Lab Results: Group B Strep negative  Results for orders placed or performed during the hospital encounter of 02/12/19 (from the past 24 hour(s))  SARS Coronavirus 2 [facility] order, Performed in [place] hospital lab) Nasopharyngeal Nasopharyngeal Swab   Collection Time: 02/12/19  9:16 AM   Specimen: Nasopharyngeal Swab  Result Value Ref Range   SARS Coronavirus 2 NEGATIVE NEGATIVE   CBC   Collection Time: 02/12/19 10:32 AM  Result Value Ref Range   WBC 7.4 4.0 - 10.5 K/uL   RBC 3.93 3.87 - 5.11 MIL/uL   Hemoglobin 11.9 (L) 12.0 - 15.0 g/dL   HCT 35.3 (L) 36.0 - 46.0 %   MCV 89.8 80.0 - 100.0 fL   MCH 30.3 26.0 - 34.0 pg   MCHC 33.7 30.0 - 36.0 g/dL   RDW 14.3 11.5 - 15.5 %   Platelets 186 150 - 400 K/uL   nRBC 0.0 0.0 - 0.2 %  Comprehensive  metabolic panel   Collection Time: 02/12/19 10:32 AM  Result Value Ref Range   Sodium 135 135 - 145 mmol/L   Potassium 3.9 3.5 - 5.1 mmol/L   Chloride 107 98 - 111 mmol/L   CO2 19 (L) 22 - 32 mmol/L   Glucose, Bld 80 70 - 99 mg/dL   BUN 11 6 - 20 mg/dL   Creatinine, Ser 0.59 0.44 - 1.00 mg/dL   Calcium 8.9 8.9 - 10.3 mg/dL   Total Protein 6.7 6.5 - 8.1 g/dL   Albumin 2.9 (L) 3.5 - 5.0 g/dL   AST 21 15 - 41 U/L   ALT 17 0 - 44 U/L   Alkaline Phosphatase 102 38 - 126 U/L   Total Bilirubin 0.4 0.3 - 1.2 mg/dL   GFR calc non Af Amer >60 >60 mL/min   GFR calc Af Amer >60 >60 mL/min   Anion gap 9 5 - 15  Type and screen   Collection Time: 02/12/19 10:32 AM  Result Value Ref Range   ABO/RH(D) O POS    Antibody Screen NEG    Sample Expiration      02/15/2019,2359 Performed at [facility] [address], [place], [place] [address]   ABO/Rh   Collection Time: 02/12/19 10:32 AM  Result Value Ref Range   ABO/RH(D)      O POS Performed at [facility] [address] [place], [place] [address]     Patient Active Problem List   Diagnosis Date Noted  . Gestational hypertension 02/11/2019  . Supervision of high risk pregnancy, antepartum 08/09/2018  . AMA (advanced maternal age) multigravida 35+ 08/09/2018  . History of cesarean delivery 08/09/2018  . 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 CONSTIPATION 12/16/2008  . OBESITY 10/12/2008  . MIGRAINE, COMMON 09/03/2008    Assessment: Amy [name] is a 38 y.o. G5X6468 at 56w0dhere for IOL for gHTN. Patient asymptomatic. PIH labs  normal. BPs normotensive.   #Labor: Induction. Cervix not favorable for FB placement. Start with Pitocin 2x2. Will attempt FB at next cervical exam.  #Pain: Plans for epidural.  #FWB: Cat I  #ID:  GBS neg  #MOF: Breast  #MOC:Undecided  #Circ:  Outpatient   CMelina Schools8/19/2020, 12:07 PM

## 2019-02-12 NOTE — Progress Notes (Signed)
LABOR PROGRESS NOTE  Amy [name] is a 38 y.o. Q2I2979 at [redacted]w[redacted]d  admitted for IOL for gHTN.   Subjective: Strip note. Discussed plan of care with RN. Patient is not feeling contractions.   Objective: BP 117/67   Pulse 75   Temp 97.8 F (36.6 C) (Oral)   Resp 18   Ht 5\' 8"  (1.727 m)   Wt 112.3 kg   LMP 05/29/2018   BMI 37.64 kg/m  or  Vitals:   02/12/19 1400 02/12/19 1430 02/12/19 1549 02/12/19 1601  BP: 115/64 117/60  117/67  Pulse: 86 95  75  Resp: 18     Temp:   97.8 F (36.6 C)   TempSrc:   Oral   Weight:      Height:        Dilation: Fingertip Effacement (%): Thick Station: -3 Presentation: Vertex Exam by:: stone rnc FHT: baseline rate 120, moderate varibility, +acel, no decel Toco: q2-6 min   Labs: Lab Results  Component Value Date   WBC 7.4 02/12/2019   HGB 11.9 (L) 02/12/2019   HCT 35.3 (L) 02/12/2019   MCV 89.8 02/12/2019   PLT 186 02/12/2019    Patient Active Problem List   Diagnosis Date Noted  . Gestational hypertension 02/11/2019  . Supervision of high risk pregnancy, antepartum 08/09/2018  . AMA (advanced maternal age) multigravida 35+ 08/09/2018  . History of cesarean delivery 08/09/2018  . 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 CONSTIPATION 12/16/2008  . OBESITY 10/12/2008  . MIGRAINE, COMMON 09/03/2008    Assessment / Plan: 38 y.o. G9Q1194 at [redacted]w[redacted]d here for IOL gHTN. BPs normotensive.   Labor: Cervix remains not favorable for FB placement. Continue on Pitocin, currently at 12 mu/min. Titrate as appropriate.  Fetal Wellbeing:  Cat I  Pain Control:  Maternal support at present. Plans for epidural when further along in labor.  Anticipated MOD:  NSVD   Phill Myron, D.O. OB Fellow  02/12/2019, 5:32 PM

## 2019-02-12 NOTE — MAU Note (Signed)
Covid swab collected. PT tolerated well. PT asymptomatic 

## 2019-02-13 ENCOUNTER — Inpatient Hospital Stay (HOSPITAL_COMMUNITY): Payer: Medicaid Other | Admitting: Anesthesiology

## 2019-02-13 ENCOUNTER — Encounter (HOSPITAL_COMMUNITY): Payer: Self-pay | Admitting: Anesthesiology

## 2019-02-13 ENCOUNTER — Encounter: Payer: Self-pay | Admitting: General Practice

## 2019-02-13 ENCOUNTER — Other Ambulatory Visit: Payer: Self-pay

## 2019-02-13 LAB — RPR: RPR Ser Ql: NONREACTIVE

## 2019-02-13 MED ORDER — EPHEDRINE 5 MG/ML INJ: 10.0000 mg | INTRAVENOUS | Status: DC | PRN

## 2019-02-13 MED ORDER — LACTATED RINGERS IV SOLN: 500.0000 mL | Freq: Once | INTRAVENOUS | Status: DC

## 2019-02-13 MED ORDER — DIPHENHYDRAMINE HCL 50 MG/ML IJ SOLN: 12.5000 mg | INTRAMUSCULAR | Status: DC | PRN

## 2019-02-13 MED ORDER — FENTANYL-BUPIVACAINE-NACL 0.5-0.125-0.9 MG/250ML-% EP SOLN
12.0000 mL/h | EPIDURAL | Status: DC | PRN
  Filled 2019-02-13: qty 250

## 2019-02-13 MED ORDER — LIDOCAINE HCL (PF) 1 % IJ SOLN
INTRAMUSCULAR | Status: DC | PRN
  Administered 2019-02-13 (×2): 4 mL via EPIDURAL

## 2019-02-13 MED ORDER — VALACYCLOVIR HCL 500 MG PO TABS
500.0000 mg | ORAL_TABLET | Freq: Two times a day (BID) | ORAL | Status: DC
  Administered 2019-02-13: 500 mg via ORAL
  Filled 2019-02-13: qty 1

## 2019-02-13 MED ORDER — LACTATED RINGERS IV SOLN
500.0000 mL | Freq: Once | INTRAVENOUS | Status: AC
  Administered 2019-02-13: 500 mL via INTRAVENOUS

## 2019-02-13 MED ORDER — SODIUM CHLORIDE (PF) 0.9 % IJ SOLN
INTRAMUSCULAR | Status: DC | PRN
  Administered 2019-02-13: 12 mL/h via EPIDURAL

## 2019-02-13 MED ORDER — PHENYLEPHRINE 40 MCG/ML (10ML) SYRINGE FOR IV PUSH (FOR BLOOD PRESSURE SUPPORT): 80.0000 ug | PREFILLED_SYRINGE | INTRAVENOUS | Status: DC | PRN

## 2019-02-13 MED ORDER — FENTANYL CITRATE (PF) 100 MCG/2ML IJ SOLN
INTRAMUSCULAR | Status: AC
  Administered 2019-02-13: 05:00:00
  Filled 2019-02-13: qty 2

## 2019-02-13 MED ORDER — FENTANYL CITRATE (PF) 100 MCG/2ML IJ SOLN
INTRAMUSCULAR | Status: AC
  Administered 2019-02-13: 03:00:00 via INTRAVENOUS
  Filled 2019-02-13: qty 2

## 2019-02-13 MED ORDER — BUTALBITAL-APAP-CAFFEINE 50-325-40 MG PO TABS
1.0000 | ORAL_TABLET | Freq: Four times a day (QID) | ORAL | Status: DC | PRN
  Administered 2019-02-13: 1 via ORAL
  Filled 2019-02-13: qty 1

## 2019-02-13 MED ORDER — FENTANYL CITRATE (PF) 100 MCG/2ML IJ SOLN
100.0000 ug | INTRAMUSCULAR | Status: DC | PRN
  Administered 2019-02-13: 03:00:00 via INTRAVENOUS
  Administered 2019-02-13: 100 ug via INTRAVENOUS
  Filled 2019-02-13: qty 2

## 2019-02-13 NOTE — Progress Notes (Signed)
Amy [name] is a 38 y.o. A3F5732 at [redacted]w[redacted]d by FT ultrasound admitted for induction of labor due to Gestational Hypertension.  Subjective: Patient comfortable, s/p pitocin and FB, had 4 hour pit break, restarted pitocin. Starting to feel contractions. Discussed further active management with AROM, patient agreed.  Objective: BP 120/65   Pulse 79   Temp 98.7 F (37.1 C) (Oral)   Resp 18   Ht 5\' 8"  (1.727 m)   Wt 112.3 kg   LMP 05/29/2018   BMI 37.64 kg/m  No intake/output data recorded. No intake/output data recorded.  FHT:  FHR: 140 bpm, variability: moderate,  accelerations:  Present,  decelerations:  Absent UC:   irregular, every 2-5 minutes SVE:   Dilation: 4 Effacement (%): 50 Station: -2 Exam by:: Lacheryl Niesen MD  AROM performed, clear fluid returned, patient tolerated procedure well  Labs: Lab Results  Component Value Date   WBC 7.4 02/12/2019   HGB 11.9 (L) 02/12/2019   HCT 35.3 (L) 02/12/2019   MCV 89.8 02/12/2019   PLT 186 02/12/2019    Assessment / Plan: Induction of labor due to gestational hypertension,  progressing well on pitocin  Labor: Prolonged latent labor, AROM performed, clear fluid, continue pitocin increase Preeclampsia:  Negative labs, no symptoms Fetal Wellbeing:  Category I Pain Control:  Labor support without medications and IV pain meds prn I/D:  GBS Neg; COVID Neg Anticipated MOD:  NSVD  Katherine Basset, DO 02/13/2019, 7:46 PM

## 2019-02-13 NOTE — Progress Notes (Signed)
Received order to allow patient to shower and eat. Patient has taken care of personal hygiene and RN changed linens. Monitors were reapplied and patient is eating.  Delorise Shiner, MSN, RN

## 2019-02-13 NOTE — Anesthesia Preprocedure Evaluation (Signed)
Anesthesia Evaluation  Patient identified by MRN, date of birth, ID band Patient awake    Reviewed: Allergy & Precautions, Patient's Chart, lab work & pertinent test results  Airway Mallampati: II  TM Distance: >3 FB Neck ROM: Full    Dental no notable dental hx. (+) Teeth Intact   Pulmonary former smoker,    Pulmonary exam normal breath sounds clear to auscultation       Cardiovascular hypertension, Normal cardiovascular exam Rhythm:Regular Rate:Normal     Neuro/Psych  Headaches, negative psych ROS   GI/Hepatic Neg liver ROS, GERD  Medicated and Controlled,  Endo/Other  Obesity  Renal/GU negative Renal ROS  negative genitourinary   Musculoskeletal negative musculoskeletal ROS (+)   Abdominal (+) + obese,   Peds  Hematology  (+) anemia ,   Anesthesia Other Findings   Reproductive/Obstetrics (+) Pregnancy HSV                             Anesthesia Physical Anesthesia Plan  ASA: III  Anesthesia Plan: Epidural   Post-op Pain Management:    Induction:   PONV Risk Score and Plan:   Airway Management Planned: Natural Airway  Additional Equipment:   Intra-op Plan:   Post-operative Plan:   Informed Consent: I have reviewed the patients History and Physical, chart, labs and discussed the procedure including the risks, benefits and alternatives for the proposed anesthesia with the patient or authorized representative who has indicated his/her understanding and acceptance.       Plan Discussed with: Anesthesiologist  Anesthesia Plan Comments:         Anesthesia Quick Evaluation

## 2019-02-13 NOTE — Progress Notes (Addendum)
Latanga Nedrow Woodis a 28 y.G.D9M4268 at [redacted]w[redacted]d admitted for IOL for gHTN.  Subjective: Pt feeling some small contractions after Pitocin was been restarted at 3pm today.   Objective: BP 118/62   Pulse 81   Temp 97.7 F (36.5 C)   Resp 19   Ht 5\' 8"  (1.727 m)   Wt 112.3 kg   LMP 05/29/2018   BMI 37.64 kg/m  No intake/output data recorded. No intake/output data recorded.  FHT:  FHR: 140 bpm, variability: moderate,  accelerations:  Present,  decelerations:  Absent UC:   irregular, every 2-7 minutes SVE:   Dilation: 3.5 Effacement (%): Thick Station: Ballotable Exam by:: Pearletha Forge, RN  Labs: Lab Results  Component Value Date   WBC 7.4 02/12/2019   HGB 11.9 (L) 02/12/2019   HCT 35.3 (L) 02/12/2019   MCV 89.8 02/12/2019   PLT 186 02/12/2019    Assessment / Plan: Deari Sessler Woodis a 59 y.T.M1D6222 at [redacted]w[redacted]d admitted for IOL for gHTN.She is a TOLAC.  Labor: s/p foley baloon and pitocin after 22 hrs. Had pitocin break between 11am and 3pm today for pt to eat light meal and moblization. Restarted pit, continue on Pit and increase gradually, in caution as pt is TOLAC. Consider AROM. Preeclampsia:  no signs or symptoms of toxicity Fetal Wellbeing:  Fetal status reassuring. Category I Pain Control:  Feeling mild contractions as Pitocin has been increased. Would like epidural in active stage of labor. I/D:  n/a  Anticipated MOD: hopeful for vaginal delivery, repeat c/s if appropriate  Poonam Patel 02/13/2019, 6:16 PM  GME ATTESTATION:  I saw and evaluated the patient. I agree with the findings and the plan of care as documented in the resident's note.  Merilyn Baba, DO OB Fellow [place] for [employer]

## 2019-02-13 NOTE — Progress Notes (Signed)
Patient ID: Amy [name], female   DOB: [birth year], 38 y.o.   MRN: [id number] Late Entry epic down  Vitals:   02/13/19 0401 02/13/19 0431 02/13/19 0458 02/13/19 0501  BP: 121/77 110/66  (!) 103/55  Pulse: 87 76  78  Resp:  18    Temp:   97.7 F (36.5 C)   TempSrc:   Oral   Weight:      Height:       FHR reactive UCs irregular  Foley inserted Cervix was 1/70%/-2/still somewhat firm  Will wait for expulsion

## 2019-02-13 NOTE — Progress Notes (Signed)
Patient has finished eating 02/13/19 at [address] MSN, RN

## 2019-02-13 NOTE — Anesthesia Procedure Notes (Signed)
Epidural Patient location during procedure: OB Start time: 02/13/2019 10:42 PM End time: 02/13/2019 10:51 PM  Staffing Anesthesiologist: Josephine Igo, MD Performed: anesthesiologist   Preanesthetic Checklist Completed: patient identified, site marked, surgical consent, pre-op evaluation, timeout performed, IV checked, risks and benefits discussed and monitors and equipment checked  Epidural Patient position: sitting Prep: site prepped and draped and DuraPrep Patient monitoring: continuous pulse ox and blood pressure Approach: midline Location: L3-L4 Injection technique: LOR air  Needle:  Needle type: Tuohy  Needle gauge: 17 G Needle length: 9 cm and 9 Needle insertion depth: 5 cm cm Catheter type: closed end flexible Catheter size: 19 Gauge Catheter at skin depth: 10 cm Test dose: negative and Other  Assessment Events: blood not aspirated, injection not painful, no injection resistance, negative IV test and no paresthesia  Additional Notes Patient identified. Risks and benefits discussed including failed block, incomplete  Pain control, post dural puncture headache, nerve damage, paralysis, blood pressure Changes, nausea, vomiting, reactions to medications-both toxic and allergic and post Partum back pain. All questions were answered. Patient expressed understanding and wished to proceed. Sterile technique was used throughout procedure. Epidural site was Dressed with sterile barrier dressing. No paresthesias, signs of intravascular injection Or signs of intrathecal spread were encountered.  Patient was more comfortable after the epidural was dosed. Please see RN's note for documentation of vital signs and FHR which are stable. Reason for block:procedure for pain

## 2019-02-13 NOTE — Progress Notes (Addendum)
Amy [name] is a 38 y.o. Q7M2263 at [redacted]w[redacted]d  admitted for IOL for gHTN.   Subjective: Pt doing well, not currently feeling any contractions.  Objective: BP 125/72   Pulse 79   Temp 97.9 F (36.6 C) (Oral)   Resp 16   Ht 5\' 8"  (1.727 m)   Wt 112.3 kg   LMP 05/29/2018   BMI 37.64 kg/m  No intake/output data recorded. No intake/output data recorded.  FHT:  FHR: 140 bpm, variability: moderate,  accelerations:  Present,  decelerations:  Absent UC:   regular, every 1.5-3.5 minutes SVE:   Dilation: 4 Effacement (%): 60 Station: -2 Exam by:: Lesia Sago, RN   Labs: Lab Results  Component Value Date   WBC 7.4 02/12/2019   HGB 11.9 (L) 02/12/2019   HCT 35.3 (L) 02/12/2019   MCV 89.8 02/12/2019   PLT 186 02/12/2019    Assessment / Plan: Amy [name] is a 38 y.o. F3L4562 at [redacted]w[redacted]d  admitted for IOL for gHTN. She is a TOLAC.  Labor: S/p foley balloon. Has been on Pitocin for 22 hours- pit break for a light meal and mobilization. Plan to restart Pitocin, consider AROM. Preeclampsia:  no signs or symptoms of toxicity Fetal Wellbeing: Fetal status reassuring. Category I Pain Control:  Not really feeling contractions, desires epidural when needed Anticipated MOD:  Hopeful for vaginal delivery, repeat C/S if appropriate  Lattie Haw MD PGY-1, [facility]  02/13/2019, 10:53 AM  GME ATTESTATION:  I saw and evaluated the patient. I agree with the findings and the plan of care as documented in the resident's note.  Merilyn Baba, DO OB Fellow [place] for [employer]

## 2019-02-14 ENCOUNTER — Encounter (HOSPITAL_COMMUNITY): Payer: Self-pay

## 2019-02-14 DIAGNOSIS — O134 Gestational [pregnancy-induced] hypertension without significant proteinuria, complicating childbirth: Secondary | ICD-10-CM

## 2019-02-14 DIAGNOSIS — O34219 Maternal care for unspecified type scar from previous cesarean delivery: Secondary | ICD-10-CM

## 2019-02-14 DIAGNOSIS — Z3A37 37 weeks gestation of pregnancy: Secondary | ICD-10-CM

## 2019-02-14 MED ORDER — DIPHENHYDRAMINE HCL 25 MG PO CAPS: 25.0000 mg | ORAL_CAPSULE | Freq: Four times a day (QID) | ORAL | Status: DC | PRN

## 2019-02-14 MED ORDER — FLEET ENEMA 7-19 GM/118ML RE ENEM: 1.0000 | ENEMA | Freq: Every day | RECTAL | Status: DC | PRN

## 2019-02-14 MED ORDER — DOCUSATE SODIUM 100 MG PO CAPS
100.0000 mg | ORAL_CAPSULE | Freq: Two times a day (BID) | ORAL | Status: DC
  Administered 2019-02-14 – 2019-02-15 (×2): 100 mg via ORAL
  Filled 2019-02-14 (×2): qty 1

## 2019-02-14 MED ORDER — COCONUT OIL OIL
1.0000 "application " | TOPICAL_OIL | Status: DC | PRN
  Administered 2019-02-14: 1 via TOPICAL

## 2019-02-14 MED ORDER — ONDANSETRON HCL 4 MG PO TABS: 4.0000 mg | ORAL_TABLET | ORAL | Status: DC | PRN

## 2019-02-14 MED ORDER — BENZOCAINE-MENTHOL 20-0.5 % EX AERO
1.0000 "application " | INHALATION_SPRAY | CUTANEOUS | Status: DC | PRN
  Administered 2019-02-14: 1 via TOPICAL
  Filled 2019-02-14: qty 56

## 2019-02-14 MED ORDER — ONDANSETRON HCL 4 MG/2ML IJ SOLN: 4.0000 mg | INTRAMUSCULAR | Status: DC | PRN

## 2019-02-14 MED ORDER — ACETAMINOPHEN 325 MG PO TABS
650.0000 mg | ORAL_TABLET | ORAL | Status: DC | PRN
  Administered 2019-02-15 (×3): 650 mg via ORAL
  Filled 2019-02-14 (×3): qty 2

## 2019-02-14 MED ORDER — IBUPROFEN 600 MG PO TABS
600.0000 mg | ORAL_TABLET | Freq: Four times a day (QID) | ORAL | Status: DC
  Administered 2019-02-14 – 2019-02-15 (×6): 600 mg via ORAL
  Filled 2019-02-14 (×6): qty 1

## 2019-02-14 MED ORDER — METHYLERGONOVINE MALEATE 0.2 MG/ML IJ SOLN: 0.2000 mg | INTRAMUSCULAR | Status: DC | PRN

## 2019-02-14 MED ORDER — TETANUS-DIPHTH-ACELL PERTUSSIS 5-2.5-18.5 LF-MCG/0.5 IM SUSP: 0.5000 mL | Freq: Once | INTRAMUSCULAR | Status: DC

## 2019-02-14 MED ORDER — SIMETHICONE 80 MG PO CHEW: 80.0000 mg | CHEWABLE_TABLET | ORAL | Status: DC | PRN

## 2019-02-14 MED ORDER — ZOLPIDEM TARTRATE 5 MG PO TABS: 5.0000 mg | ORAL_TABLET | Freq: Every evening | ORAL | Status: DC | PRN

## 2019-02-14 MED ORDER — WITCH HAZEL-GLYCERIN EX PADS: 1.0000 "application " | MEDICATED_PAD | CUTANEOUS | Status: DC | PRN

## 2019-02-14 MED ORDER — MISOPROSTOL 200 MCG PO TABS
ORAL_TABLET | ORAL | Status: AC
  Administered 2019-02-14: 400 ug via BUCCAL
  Filled 2019-02-14: qty 2

## 2019-02-14 MED ORDER — METHYLERGONOVINE MALEATE 0.2 MG PO TABS: 0.2000 mg | ORAL_TABLET | ORAL | Status: DC | PRN

## 2019-02-14 MED ORDER — MISOPROSTOL 200 MCG PO TABS
400.0000 ug | ORAL_TABLET | Freq: Once | ORAL | Status: AC
  Administered 2019-02-14: 400 ug via BUCCAL

## 2019-02-14 MED ORDER — DIBUCAINE (PERIANAL) 1 % EX OINT
1.0000 "application " | TOPICAL_OINTMENT | CUTANEOUS | Status: DC | PRN
  Administered 2019-02-14: 1 via RECTAL
  Filled 2019-02-14: qty 28

## 2019-02-14 MED ORDER — FERROUS SULFATE 325 (65 FE) MG PO TABS
325.0000 mg | ORAL_TABLET | Freq: Two times a day (BID) | ORAL | Status: DC
  Administered 2019-02-14 – 2019-02-15 (×2): 325 mg via ORAL
  Filled 2019-02-14 (×2): qty 1

## 2019-02-14 MED ORDER — BISACODYL 10 MG RE SUPP: 10.0000 mg | Freq: Every day | RECTAL | Status: DC | PRN

## 2019-02-14 MED ORDER — PRENATAL MULTIVITAMIN CH
1.0000 | ORAL_TABLET | Freq: Every day | ORAL | Status: DC
  Administered 2019-02-14 – 2019-02-15 (×2): 1 via ORAL
  Filled 2019-02-14 (×2): qty 1

## 2019-02-14 MED ORDER — MEASLES, MUMPS & RUBELLA VAC IJ SOLR: 0.5000 mL | Freq: Once | INTRAMUSCULAR | Status: DC

## 2019-02-14 NOTE — Lactation Note (Signed)
Lactation Consultation Note  Patient Name: Amy [name] [id number] Date: 02/14/2019 Reason for consult: Initial assessment;Early term 62-38.6wks   Mom in bed about to eat lunch.  She bf her 25 and 38 year old for 3 months.  Desires to bf this child longer.    Infant wrapped in blankets in bassinet.  LC reviewed bf basics, feeding cues, hand exp was explained but mom declined assistance at this time but says she will call back.   Mom's RN provided shells and a hand pump due to "flat nipples" per mom.  LC reviewed importance of position, support of breast and infant head and neck when latching.   Mom prefers to call out to lactation with next feed.  Mar[place] left brochure and explained voicemail option for questions and OP [place] appointments.  LC also briefly reviewed importance of STS, feeding with cues and not allowing longer than 3 hours between feeds.  Some points on LPTI guidelines reviewed such as STS, keeping infant warm, and making sure RN is notified if infant doesn't wake to feed.   All of mom's questions were answered and she was encouraged to call out for assistance with next feed.   Maternal Data Has patient been taught Hand Expression?: (declined teaching at this time, (wanting to eat)) Does the patient have breastfeeding experience prior to this delivery?: Yes  Feeding    LATCH Score                   Interventions Interventions: Breast feeding basics reviewed;Shells  Lactation Tools Discussed/Used Tools: Shells;Pump Breast pump type: Manual   Consult Status Consult Status: Follow-up Date: 02/15/19 Follow-up type: In-patient    [name] [facility] 02/14/2019, 4:14 PM

## 2019-02-14 NOTE — Progress Notes (Signed)
LABOR PROGRESS NOTE  Amy [name] is a 38 y.o. [id number] at [redacted]w[redacted]d  admitted for IOL for gHTN, [place].  Subjective: S/p epidural, working well Disappointed she is still only 4cm  Objective: BP 111/66   Pulse 78   Temp 98 F (36.7 C) (Oral)   Resp 19   Ht 5\' 8"  (1.727 m)   Wt 112.3 kg   LMP 05/29/2018   BMI 37.64 kg/m  or  Vitals:   02/13/19 2305 02/13/19 2310 02/13/19 2315 02/13/19 2330  BP: 119/66 123/70 114/70 111/66  Pulse: 71 84 73 78  Resp:      Temp:      TempSrc:      Weight:      Height:         Dilation: 4 Effacement (%): 60 Cervical Position: Posterior Station: -1 Presentation: Vertex Exam by:: Chukwuemeka S RNC FHT: baseline rate 135, moderate varibility, +acel, -decel Toco: ~q4min ctx  Labs: Lab Results  Component Value Date   WBC 7.4 02/12/2019   HGB 11.9 (L) 02/12/2019   HCT 35.3 (L) 02/12/2019   MCV 89.8 02/12/2019   PLT 186 02/12/2019    Patient Active Problem List   Diagnosis Date Noted  . Gestational hypertension 02/11/2019  . Supervision of high risk pregnancy, antepartum 08/09/2018  . AMA (advanced maternal age) multigravida 35+ 08/09/2018  . History of cesarean delivery 08/09/2018  . 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 CONSTIPATION 12/16/2008  . OBESITY 10/12/2008  . MIGRAINE, COMMON 09/03/2008    Assessment / Plan: 38 y.o. [id number] at [redacted]w[redacted]d here for IOL for gHTN, TOLAC.   Labor: s/p FB, pit, AROM. Now w epidural, cont to uptitrate pitocin Fetal Wellbeing:  Cat I Pain Control:  epidural Anticipated MOD:  SVD  Clarnce Flock MD/[facility] OB Fellow  02/14/2019, 12:20 AM

## 2019-02-14 NOTE — Discharge Summary (Signed)
Postpartum Discharge Summary     Patient Name: Amy [name] DOB: August 18, 1980 MRN: [id number]  Date of admission: 02/12/2019 Delivering Provider: Clarnce Flock   Date of discharge: 02/15/2019  Admitting diagnosis: INDUCTION Intrauterine pregnancy: [redacted]w[redacted]d     Secondary diagnosis:  Active Problems:   OBESITY   HSV-2 (herpes simplex virus 2) infection   AMA (advanced maternal age) multigravida 35+   History of cesarean delivery   Gestational hypertension  Additional problems: none     Discharge diagnosis: Term Pregnancy Delivered, VBAC and Gestational Hypertension                                                                                                Post partum procedures: None  Augmentation: AROM, Pitocin, Cytotec and Foley Balloon  Complications: None  Hospital course:  Induction of Labor With Vaginal Delivery   38 y.o. yo [phone] at [redacted]w[redacted]d was admitted to the hospital 02/12/2019 for induction of labor.  Indication for induction: Gestational hypertension. NO s/s recent HSV infection. She remained essentially normotensive with neg pre-e labs. Patient had a labor course remarkable for cx ripening with the usual methods followed by delivery approx 44hrs later. Membrane Rupture Time/Date: 7:33 PM ,02/13/2019   Intrapartum Procedures: Episiotomy:                                          Lacerations:  1st degree [2]  Patient had delivery of a Viable infant.  Information for the patient's newborn:  Markasia, Carrol [[id number]]  Delivery Method: VBAC, Spontaneous(Filed from Delivery Summary)    02/14/2019  Details of delivery can be found in separate delivery note.  Patient had a routine postpartum course. She remained normotensive. Patient decided to do more research and possibly have [place] outpatient. Patient opted for early discharge home on PPD#1.  Magnesium Sulfate recieved: No BMZ received: No  Physical exam  Vitals:   02/14/19 1430 02/14/19 1752 02/14/19  2212 02/15/19 0614  BP: 105/62 (!) 99/55 111/65 (!) 91/47  Pulse: 85 85 81 72  Resp: 18 17 18 18   Temp: 98.5 F (36.9 C) 98.1 F (36.7 C) 98.1 F (36.7 C) 97.9 F (36.6 C)  TempSrc: Oral Oral Oral Oral  SpO2:   97% 97%  Weight:      Height:       General: alert and cooperative Lochia: appropriate Uterine Fundus: firm Incision: N/A DVT Evaluation: No evidence of DVT seen on physical exam. Labs: Lab Results  Component Value Date   WBC 7.4 02/12/2019   HGB 11.9 (L) 02/12/2019   HCT 35.3 (L) 02/12/2019   MCV 89.8 02/12/2019   PLT 186 02/12/2019   CMP Latest Ref Rng & Units 02/12/2019  Glucose 70 - 99 mg/dL 80  BUN 6 - 20 mg/dL 11  Creatinine 0.44 - 1.00 mg/dL 0.59  Sodium 135 - 145 mmol/L 135  Potassium 3.5 - 5.1 mmol/L 3.9  Chloride 98 - 111 mmol/L 107  CO2 22 - 32 mmol/L  19(L)  Calcium 8.9 - 10.3 mg/dL 8.9  Total Protein 6.5 - 8.1 g/dL 6.7  Total Bilirubin 0.3 - 1.2 mg/dL 0.4  Alkaline Phos 38 - 126 U/L 102  AST 15 - 41 U/L 21  ALT 0 - 44 U/L 17    Discharge instruction: per After Visit Summary and "Baby and Me Booklet".  After visit meds:  Allergies as of 02/15/2019      Reactions   Shrimp [shellfish Allergy] Swelling   Swollen Lips      Medication List    STOP taking these medications   acetaminophen 500 MG tablet Commonly known as: TYLENOL   AMBULATORY NON FORMULARY MEDICATION   cyclobenzaprine 5 MG tablet Commonly known as: FLEXERIL   pantoprazole 20 MG tablet Commonly known as: Protonix   terconazole 0.4 % vaginal cream Commonly known as: Terazol 7   valACYclovir 500 MG tablet Commonly known as: VALTREX     TAKE these medications   FLONASE NA Place into the nose.   fluticasone 50 MCG/ACT nasal spray Commonly known as: Flonase Place 2 sprays into both nostrils 2 (two) times daily as needed for allergies or rhinitis.   ibuprofen 600 MG tablet Commonly known as: ADVIL Take 1 tablet (600 mg total) by mouth every 6 (six) hours as  needed.   prenatal vitamin w/FE, FA 27-1 MG Tabs tablet Take 1 tablet by mouth daily at 12 noon.   sodium chloride 0.65 % Soln nasal spray Commonly known as: OCEAN Place 1-2 sprays into both nostrils as needed for congestion. Reported on 10/07/2015       Diet: routine diet  Activity: Advance as tolerated. Pelvic rest for 6 weeks.   Outpatient follow up:BP check in 1wk; PP visit in 4wks Follow up Appt:No future appointments. Follow up Visit: Follow-up Information    [facility] Call in 1 week(s).   Specialty: Obstetrics and Gynecology Why: You will need a blood pressure check in 1 week and a postpartum visit in 4-6 weeks. Contact information: [address], [address] [id number] mc [place] [phone]           Please schedule this patient for Postpartum visit in: 4 weeks with the following provider: Any provider For C/S patients schedule nurse incision check in weeks 2 weeks:  High risk pregnancy complicated by: HTN Delivery mode:  SVD Anticipated Birth Control:  other/unsure PP Procedures needed: BP check  Schedule Integrated BH visit: no   Newborn Data: Live born female  Birth Weight:  3504gm (7lb 11.6oz) APGAR: 8, 9  Newborn Delivery   Birth date/time: 02/14/2019 07:09:00 Delivery type: Vaginal, Spontaneous (VBAC)      Baby Feeding: Breast Disposition:home with mother   02/15/2019 Joselyn Arrowhelsea N Adrijana Haros, MD  10:40 AM

## 2019-02-14 NOTE — Anesthesia Postprocedure Evaluation (Signed)
Anesthesia Post Note  Patient: Amy [name]  Procedure(s) Performed: AN AD HOC LABOR EPIDURAL     Patient location during evaluation: Mother Baby Anesthesia Type: Epidural Level of consciousness: awake and alert, oriented and patient cooperative Pain management: pain level controlled Vital Signs Assessment: post-procedure vital signs reviewed and stable Respiratory status: spontaneous breathing Cardiovascular status: stable Postop Assessment: no headache, epidural receding, patient able to bend at knees, no signs of nausea or vomiting and able to ambulate Anesthetic complications: no Comments: Pt. Interviewed via phone call d/t COVID 19 precautions.  Pt. States she is walking.  Pain score 3.     Last Vitals:  Vitals:   02/14/19 0915 02/14/19 1015  BP: 107/72 115/70  Pulse: 70 75  Resp: 18 18  Temp: 37.1 C 36.8 C    Last Pain:  Vitals:   02/14/19 1324  TempSrc:   PainSc: 4    Pain Goal:                   [facility]

## 2019-02-14 NOTE — Progress Notes (Signed)
LABOR PROGRESS NOTE  Amy [name] is a 38 y.o. [id number] at [redacted]w[redacted]d  admitted for IOL for gHTN, [place].   Subjective: Patient does not need anything. Legs feel heavy from epidural. She has felt more pressure in her bottom.   Objective: BP 122/73   Pulse 90   Temp 98 F (36.7 C) (Oral)   Resp 18   Ht 5\' 8"  (1.727 m)   Wt 112.3 kg   LMP 05/29/2018   BMI 37.64 kg/m  or  Vitals:   02/14/19 0131 02/14/19 0201 02/14/19 0231 02/14/19 0301  BP: (!) 106/56 93/78 107/72 122/73  Pulse: 77 (!) 178 97 90  Resp: 18  18   Temp:      TempSrc:      Weight:      Height:       Dilation: 8 Effacement (%): 90 Cervical Position: Midline  Station: +1 Presentation: Vertex FHT: baseline rate 140, moderate varibility, +acel, early and variable decel Toco: q3-5 mion   Labs: Lab Results  Component Value Date   WBC 7.4 02/12/2019   HGB 11.9 (L) 02/12/2019   HCT 35.3 (L) 02/12/2019   MCV 89.8 02/12/2019   PLT 186 02/12/2019    Patient Active Problem List   Diagnosis Date Noted  . Gestational hypertension 02/11/2019  . Supervision of high risk pregnancy, antepartum 08/09/2018  . AMA (advanced maternal age) multigravida 35+ 08/09/2018  . History of cesarean delivery 08/09/2018  . 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 CONSTIPATION 12/16/2008  . OBESITY 10/12/2008  . MIGRAINE, COMMON 09/03/2008    Assessment / Plan: 38 y.o. [id number] at [redacted]w[redacted]d here for IOL for gHTN. BPs stable.   Labor: Patient on pitocin at 20 mu/min. Assessed patient for need for IUPC placement; however found to have made good cervical change and descent of fetal station.  Fetal Wellbeing:  Cat II but overall reassuring with moderate variability and acels  Pain Control:  Epidural in place.  Anticipated MOD:  NSVD   Phill Myron, D.O. OB Fellow  02/14/2019, 3:02 AM

## 2019-02-15 MED ORDER — IBUPROFEN 600 MG PO TABS: 600.0000 mg | ORAL_TABLET | Freq: Four times a day (QID) | ORAL | 0 refills | Status: AC | PRN

## 2019-02-15 NOTE — Discharge Instructions (Signed)

## 2019-02-15 NOTE — Lactation Note (Signed)
This note was copied from a baby's chart. Lactation Consultation Note  Patient Name: Amy [name] [id number] Date: 02/15/2019 Reason for consult: Follow-up assessment;Early term 37-38.6wks;Difficult latch Baby is 28 hours old/5% weight loss.  Mom states baby is latching well to right breast but using a nipple shield on left.  Baby awake and showing feeding cues.  Mom positioned baby in cross cradle hold on left side.  Areolar edema makes breast compression slightly difficult.  Nipple short shafted.  Mom was wearing shells earlier but has taken them off.  Recommended she begin wearing again.  Baby latched briefly but unable to sustain latch.  24 mm nipple shield applied.  Baby latched well with good depth.  15 minute feeding observed and baby actively feeding when I left room.  Discussed milk coming to volume and the prevention and treatment of engorgement.  Mom has a manual pump for prn use.  Recommended post pumping when using a nipple shield.  Questions answered.  Lactation outpatient services and support reviewed and encouraged prn.  Maternal Data    Feeding Feeding Type: Breast Fed  LATCH Score Latch: Grasps breast easily, tongue down, lips flanged, rhythmical sucking.  Audible Swallowing: A few with stimulation  Type of Nipple: Everted at rest and after stimulation(short shafted, areolar edema)  Comfort (Breast/Nipple): Soft / non-tender  Hold (Positioning): Assistance needed to correctly position infant at breast and maintain latch.  LATCH Score: 8  Interventions Interventions: Assisted with latch;Breast massage;Hand pump  Lactation Tools Discussed/Used Tools: Nipple Shields Nipple shield size: 24   Consult Status Consult Status: Complete    Baylor Teegarden S 02/15/2019, 11:13 AM

## 2019-02-16 ENCOUNTER — Ambulatory Visit: Payer: Self-pay

## 2019-02-16 NOTE — Lactation Note (Signed)
This note was copied from a baby's chart. Lactation Consultation Note  Patient Name: Amy [name] [id number] Date: 02/16/2019 Reason for consult: Follow-up assessment;Early term 37-38.6wks;Hyperbilirubinemia;Other (Comment);Infant weight loss(placed on single photo tx after baby latched/ 10 % weight loss) Baby is 91 hours old  D/C held due to baby serum bili level and being placed on lights and 10 % weight loss.  As LC entered the room baby fussy and ready to feed.  LC assisted mom to get positioned and LC assisted to latch the baby on the right breast / football with compressions. Swallows noted and increased with compressions and warm moist heat to the breast.  LC recommended feed in the baby 1st and have the lights under him as he is feeding. After feeding the nurse will set up the DEBP and see if there is EBM yield to supplement back to baby. If not mom aware  Baby will have to be supplemented with formula.  [place] asked the MBU RN [employer] to set up the DEBP .    Maternal Data Has patient been taught Hand Expression?: Yes  Feeding Feeding Type: Breast Fed  LATCH Score Latch: Grasps breast easily, tongue down, lips flanged, rhythmical sucking.  Audible Swallowing: Spontaneous and intermittent  Type of Nipple: Everted at rest and after stimulation  Comfort (Breast/Nipple): Soft / non-tender  Hold (Positioning): Assistance needed to correctly position infant at breast and maintain latch.  LATCH Score: 9  Interventions Interventions: Breast feeding basics reviewed  Lactation Tools Discussed/Used Tools: Shells;Pump Nipple shield size: 24(latched without the NS) Shell Type: Inverted Breast pump type: Double-Electric Breast Pump;Manual   Consult Status Consult Status: Follow-up Date: 02/17/19 Follow-up type: In-patient    [place] 02/16/2019, 11:15 AM

## 2019-02-17 ENCOUNTER — Ambulatory Visit: Payer: Self-pay

## 2019-02-17 NOTE — Lactation Note (Signed)
This note was copied from a baby's chart. Lactation Consultation Note Mom sleeping. Spoke w/RN about mom supplementing. RN stated she thinks mom's milk is coming in and mom is giving baby colostrum. Mom's prefers not to give formula. RN stated mom pumped 10 ml.  Baby on DPT. Expressed to RN importance of mom supplementing.  Patient Name: Amy [name] Date: 02/17/2019     Maternal Data    Feeding Feeding Type: Breast Fed  LATCH Score Latch: Repeated attempts needed to sustain latch, nipple held in mouth throughout feeding, stimulation needed to elicit sucking reflex.  Audible Swallowing: A few with stimulation  Type of Nipple: Everted at rest and after stimulation  Comfort (Breast/Nipple): Soft / non-tender  Hold (Positioning): No assistance needed to correctly position infant at breast.  LATCH Score: 8  Interventions    Lactation Tools Discussed/Used     Consult Status      Amy [name] G 02/17/2019, 3:04 AM

## 2019-02-17 NOTE — Lactation Note (Signed)
This note was copied from a baby's chart. Lactation Consultation Note  Patient Name: Amy [name] GOTLX'B Date: 02/17/2019 Reason for consult: Early term 37-38.6wks;Hyperbilirubinemia;Infant weight loss   Maternal Data Formula Feeding for Exclusion: No Has patient been taught Hand Expression?: Yes Does the patient have breastfeeding experience prior to this delivery?: Yes  Feeding Feeding Type: Bottle Fed - Formula Nipple Type: Slow - flow  LATCH Score Latch: Grasps breast easily, tongue down, lips flanged, rhythmical sucking.  Audible Swallowing: Spontaneous and intermittent  Type of Nipple: Everted at rest and after stimulation(short shafted but compressible)  Comfort (Breast/Nipple): Filling, red/small blisters or bruises, mild/mod discomfort  Hold (Positioning): Assistance needed to correctly position infant at breast and maintain latch.  LATCH Score: 8  Interventions Interventions: Breast feeding basics reviewed;Assisted with latch;Skin to skin;Breast massage;Hand express;Breast compression;Adjust position;DEBP;Hand pump;Shells;Coconut oil;Expressed milk;Position options;Support pillows  Lactation Tools Discussed/Used Tools: Shells;Pump;Flanges;Coconut oil Flange Size: 27 Shell Type: Inverted Breast pump type: Double-Electric Breast Pump;Manual WIC Program: No(Plans to apply; suggested she call [facility] today) Pump Review: Setup, frequency, and cleaning;Milk Storage(Reviewed) Initiated by:: Amy [name] Date initiated:: 02/17/19   Consult Status Consult Status: Follow-up Date: 02/18/19 Follow-up type: In-patient    Amy [name] 02/17/2019, 11:11 AM

## 2019-02-17 NOTE — Lactation Note (Signed)
This note was copied from a baby's chart. Lactation Consultation Note  Patient Name: Amy [name] [id number] Date: 02/17/2019 Reason for consult: Mother's request;Follow-up assessment;Other (Comment);Infant weight loss(parents wanted to update the LC on how the baby did/ 11 % weight loss) Baby is 90 hours old .  [place] visited mom and dad / baby and per mom and dad the last feeding went well and followed the [facility] plan and it went well.  Mom and dad had questions about the [facility] plan.  [place] praised them both.  Will F/U tomorrow .   Maternal Data    Feeding Feeding Type: Breast Milk with Formula added Nipple Type: Slow - flow  LATCH Score                   Interventions Interventions: Breast feeding basics reviewed  Lactation Tools Discussed/Used Tools: Pump;Shells Shell Type: Inverted Breast pump type: Double-Electric Breast Pump   Consult Status Consult Status: Follow-up Date: 02/18/19 Follow-up type: In-patient    [place]mal 02/17/2019, 4:07 PM

## 2019-02-17 NOTE — Lactation Note (Addendum)
This note was copied from a baby's chart. Lactation Consultation Note  Patient Name: Amy [name] ZOXWR'UToday's Date: 02/17/2019 Reason for consult: Early term 37-38.6wks;Hyperbilirubinemia;Infant weight loss  P3 mother whose infant is now 3475 hours old.  This is an ETI at 37+2 weeks with an 11% weight loss.  Baby is under phototherapy.  Baby was under phototherapy and in the bassinet when I arrived.  Mother has been breast feeding and only giving minimal volumes of supplementation.  Discussed the weight loss and the jaundice level in depth with the parents.  Expressed a desire to have a workable feeding plan in place for today and tonight to help decrease weight loss and bilirubin levels.  Parents receptive to learning and willing to work to achieve this goal.    Since I have not observed this baby feeding I offered to assist with latching and mother agreeable.  Mother's breasts are large, soft and non tender and nipples are short shafted but compressible.  Her nipples are irritated and a small blister to the left nipple from previous poor latching.  Informed mother about using EBM/coconut oil to nipples/areolas for comfort.  Mother is already using the coconut oil and will begin using EBM.    Assisted baby to latch to the left breast in the football hold without difficulty.  Gentle stimulation required to begin feeding, however, once he began sucking his latch was good and effective.  [place] noted and demonstrated breast compressions for mother.  Parents pleased to hear swallows.  Techniques shown to help keep him awake at the breast.  Observed him feeding for 15 minutes before he became very sleepy.  Demonstrated burping and reviewed the LPTI supplementation guideline sheet with parents.  Suggested baby supplement with 30mls after breast feeding to help minimize weight loss and decrease bilirubin levels.  Explained phototherapy and bilirubin to parents.    Demonstrated paced bottle feeding for  parents and burped after every 10 mls.  Using the green Enfamil slow flow nipple baby was able to easily consume 30 mls with no difficulty or emesis.  Burped well and laid back in bassinet.    Reviewed the DEBP and answered questions about the pump.  #27 flange size is appropriate at this time.  Observed mother pumping for approximately 5 minutes and she was getting milk from both breasts.  Reviewed cleaning of pump parts.  Feeding plan reviewed and parents excited to continue this plan.  Encouraged mother to call for latch assistance as needed.  Reminded them that baby should be actively feeding and to call for help if they cannot get him to feed effectively.  Parents verbalized understanding.  RN updated.  Mother does not have a DEBP for home use.  Explained the desire for a DEBP and mother plans to obtain one.  Provided information about the hospital pump rental program which she may be interested in.  [facility] and I suggested she call as soon as possible to qualification.  [facility] in case mother is eligible to obtain a pump at hospital discharge.  Mother will call WIC.    Maternal Data Formula Feeding for Exclusion: No Has patient been taught Hand Expression?: Yes Does the patient have breastfeeding experience prior to this delivery?: Yes  Feeding Feeding Type: Breast Fed  LATCH Score Latch: Grasps breast easily, tongue down, lips flanged, rhythmical sucking.  Audible Swallowing: Spontaneous and intermittent  Type of Nipple: Everted at rest and after stimulation(short shafted but  compressible)  Comfort (Breast/Nipple): Filling, red/small blisters or bruises, mild/mod discomfort  Hold (Positioning): Assistance needed to correctly position infant at breast and maintain latch.  LATCH Score: 8  Interventions Interventions: Breast feeding basics reviewed;Assisted with latch;Skin to skin;Breast massage;Hand express;Breast compression;Adjust  position;DEBP;Hand pump;Shells;Coconut oil;Expressed milk;Position options;Support pillows  Lactation Tools Discussed/Used Tools: Shells;Pump;Flanges;Coconut oil Flange Size: 27 Shell Type: Inverted Breast pump type: Double-Electric Breast Pump;Manual WIC Program: No(Plans to apply; suggested she call [facility] today) Pump Review: Setup, frequency, and cleaning;Milk Storage(Reviewed) Initiated by:: Amy [name] Date initiated:: 02/17/19   Consult Status Consult Status: Follow-up Date: 02/18/19 Follow-up type: In-patient    Amy [name] Amy [name] 02/17/2019, 10:51 AM

## 2019-02-18 ENCOUNTER — Ambulatory Visit: Payer: Self-pay

## 2019-02-18 NOTE — Lactation Note (Signed)
This note was copied from a baby's chart. Lactation Consultation Note  Patient Name: Boy Paris Hohn RCVEL'F Date: 02/18/2019 Reason for consult: Follow-up assessment;Early term 37-38.6wks;Primapara;1st time breastfeeding;Hyperbilirubinemia  P3 mother whose infant is now 49 days old.  This is an ETI at 37+2 weeks with a current weight loss of 7% which is down from an 11% weight loss yesterday morning.  Baby was asleep in the bassinet when I arrived.  Mother has continued to follow the feeding plan we established yesterday and baby has been satisfied and feeding well.  Mother will continue to breast feed on cue and follow with supplementation until her next pediatrician's office visit.  Parents are very happy about the progress made with their baby.  Praised the family for working well together to make this feeding plan successful.  Mother's breasts are heavier this a.m.  Breasts are soft and nipples are everted and intact.  Mother is using EBM and coconut oil for comfort and lubrication.  Engorgement prevention/treatment reviewed.  Manual pump at bedside.  Mother will be obtaining a DEBP from the [facility] office after discharge.  She has our OP phone number for questions/concerns after discharge.   Maternal Data    Feeding Feeding Type: Bottle Fed - Formula Nipple Type: Slow - flow  LATCH Score                   Interventions    Lactation Tools Discussed/Used     Consult Status Consult Status: Complete Date: 02/18/19 Follow-up type: Call as needed    Lanice Schwab Zayan Delvecchio 02/18/2019, 8:46 AM

## 2019-02-19 ENCOUNTER — Telehealth: Payer: Medicaid Other | Admitting: Medical

## 2019-02-25 ENCOUNTER — Telehealth: Payer: Medicaid Other

## 2019-03-05 ENCOUNTER — Other Ambulatory Visit: Payer: Medicaid Other

## 2019-03-05 ENCOUNTER — Encounter: Payer: Medicaid Other | Admitting: Family Medicine

## 2019-03-10 ENCOUNTER — Ambulatory Visit: Payer: Medicaid Other

## 2019-03-27 ENCOUNTER — Other Ambulatory Visit: Payer: Self-pay

## 2019-03-27 ENCOUNTER — Encounter: Payer: Self-pay | Admitting: Obstetrics & Gynecology

## 2019-03-27 ENCOUNTER — Ambulatory Visit (INDEPENDENT_AMBULATORY_CARE_PROVIDER_SITE_OTHER): Payer: Medicaid Other | Admitting: Obstetrics & Gynecology

## 2019-03-27 DIAGNOSIS — Z3042 Encounter for surveillance of injectable contraceptive: Secondary | ICD-10-CM | POA: Diagnosis not present

## 2019-03-27 DIAGNOSIS — Z1389 Encounter for screening for other disorder: Secondary | ICD-10-CM | POA: Diagnosis not present

## 2019-03-27 DIAGNOSIS — Z98891 History of uterine scar from previous surgery: Secondary | ICD-10-CM

## 2019-03-27 MED ORDER — MEDROXYPROGESTERONE ACETATE 150 MG/ML IM SUSP
150.0000 mg | Freq: Once | INTRAMUSCULAR | Status: AC
  Administered 2019-03-27: 150 mg via INTRAMUSCULAR

## 2019-03-27 NOTE — Progress Notes (Signed)
Subjective:     Amy [name] is a 38 y.o. female who presents for a postpartum visit. She is 6 weeks postpartum following a spontaneous vaginal delivery. I have fully reviewed the prenatal and intrapartum course. The delivery was at [redacted]w[redacted]d gestational weeks. Outcome: spontaneous vaginal delivery. Anesthesia: epidural. Postpartum course has been normal. Baby's course has been normal. Baby is feeding by both breast and bottle - Jerlyn Ly Start. Bleeding staining only. Bowel function is normal. Bladder function is normal. Patient is not sexually active. Contraception method is unsure. Postpartum depression screening: negative.  The following portions of the patient's history were reviewed and updated as appropriate: allergies, current medications, past family history, past medical history, past social history, past surgical history and problem list.  Review of Systems Pertinent items noted in HPI and remainder of comprehensive ROS otherwise negative.   Objective:    BP 117/69   Pulse 80   Temp 98.3 F (36.8 C)   Wt 224 lb 9.6 oz (101.9 kg)   BMI 34.15 kg/m   General:  alert, cooperative and no distress   Breasts:  negative  Lungs: normal rate and effort  Heart:  regular rate and rhythm  Abdomen: normal findings: no problems   Vulva:  not evaluated  Vagina: not evaluated  Cervix:  pt deferred exam  Corpus: not examined  Adnexa:  not evaluated  Rectal Exam: Not performed.        Assessment:    Postpartum exam. Pt did not score high on depression screening  Pt does seem anxious and if providing most of hte care for her children alone.  Pt referred to Lorin Picket for counseling and evaluation.  It is too late in the day for her to go today Discussed LARCs and Depo.  Pt opts for depo at this time.  She understands risk of unscheduled bleeding and possibility of 10 lb weight gain. Depo today and RTC 12 weeks.  Plan:    1. Contraception: Depo-Provera injections 2. Needs appt with  Jamie  3. Follow up in: 12 weeks or as needed.

## 2019-03-30 ENCOUNTER — Encounter: Payer: Self-pay | Admitting: Obstetrics & Gynecology

## 2019-05-19 ENCOUNTER — Telehealth (INDEPENDENT_AMBULATORY_CARE_PROVIDER_SITE_OTHER): Payer: Medicaid Other | Admitting: Family Medicine

## 2019-05-19 ENCOUNTER — Other Ambulatory Visit: Payer: Self-pay

## 2019-05-19 DIAGNOSIS — N760 Acute vaginitis: Secondary | ICD-10-CM

## 2019-05-19 MED ORDER — FLUCONAZOLE 150 MG PO TABS: 150.0000 mg | ORAL_TABLET | Freq: Once | ORAL | 0 refills | Status: AC

## 2019-05-19 MED ORDER — NITROFURANTOIN MONOHYD MACRO 100 MG PO CAPS: 100.0000 mg | ORAL_CAPSULE | Freq: Two times a day (BID) | ORAL | 0 refills | Status: AC

## 2019-05-19 NOTE — Progress Notes (Signed)
Called pt @ 1727 and LM that I am calling in regards to to her appt if she could please give the office a call.  I will call in 15 min in which if I do not reach you I will have to request that you reschedule your appt.    I connected with  Karie Soda on 05/19/19 at  5:20 PM EST by telephone and verified that I am speaking with the correct person using two identifiers.   I discussed the limitations, risks, security and privacy concerns of performing an evaluation and management service by telephone and the availability of in person appointments. I also discussed with the patient that there may be a patient responsible charge related to this service. The patient expressed understanding and agreed to proceed.  Verdell Carmine, RN 05/19/2019  5:51 PM

## 2019-05-19 NOTE — Progress Notes (Signed)
I connected with patient on 05/19/2019 at  5:20 PM EST by: MyChart video and verified that I am speaking with the correct person using two identifiers.  Patient is located at home and provider is located at [facility] office.     The purpose of this virtual visit is to provide medical care while limiting exposure to the novel coronavirus. I discussed the limitations, risks, security and privacy concerns of performing an evaluation and management service by MyChart video and the availability of in person appointments. I also discussed with the patient that there may be a patient responsible charge related to this service. By engaging in this virtual visit, you consent to the provision of healthcare.  Additionally, you authorize for your insurance to be billed for the services provided during this visit.  The patient expressed understanding and agreed to proceed.  The following staff members participated in the virtual visit:  Clarnce Flock   Virtual Visit Note Subjective:    [name] is a 38 y.o. [id number] female who presents for same day virtual visit.  #Sinus congestion Has been taking sudafed for clogged L ear Taking flonase inconsistently  #Vaginal complaints Has had some dryness and itchiness vaginally Also some burning w urination No vaginal discharge, no odor Had applied coconut oil   Review of Systems Complete ROS negative except as noted in HPI  Objective:  There were no vitals filed for this visit. Self-Obtained Gen: NAD Pulm: comfortable and conversant on room air       Assessment:   Plan:   1. Congestion - rec flonase, amenable  2. Vaginal itchiness/dysuria Unclear and difficult to diagnose virtually Given pandemic discussed coming to clinic vs empiric treatment of possible etiologies Yeast vaginitis vs UTI most likely possibilities Patient amenable w empiric tx, warning signs to present for care given Rx for fluconazole, macrobid sent   18 minutes of  non-face-to-face time spent with the patient   Clarnce Flock, MD 05/20/2019 9:45 PM

## 2019-06-14 IMAGING — US US OB < 14 WEEKS - US OB TV
1 series · 15 of 28 positions shown · non-contrast
Comparison: 09/19/2015

CLINICAL DATA: Vaginal bleeding.  First trimester pregnancy.

EXAM:
OBSTETRIC <14 WK US AND TRANSVAGINAL OB US
TECHNIQUE: Transvaginal ultrasound was performed for complete evaluation of the
gestation as well as the maternal uterus, adnexal regions, and
pelvic cul-de-sac.

[Series 1: us ob < 14 weeks - us ob tv · 61 acquisitions, 15 frames shown]
[im 1/61]
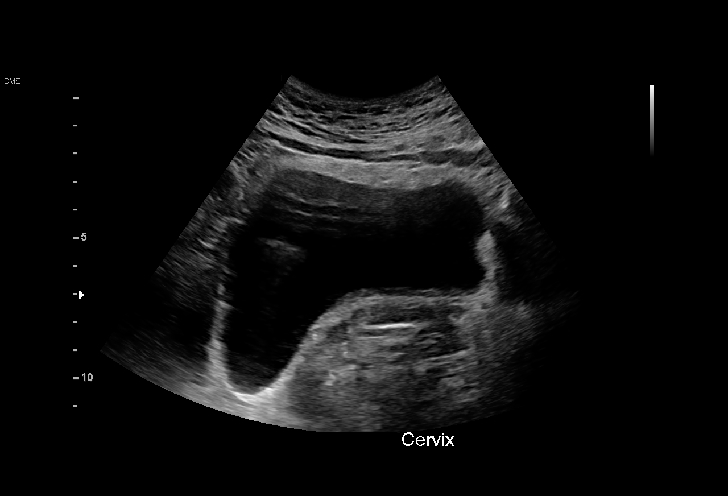
[im 5/61]
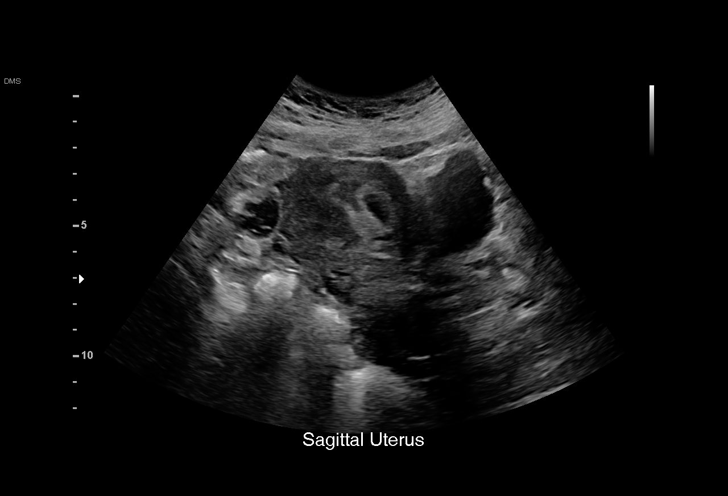
[im 9/61]
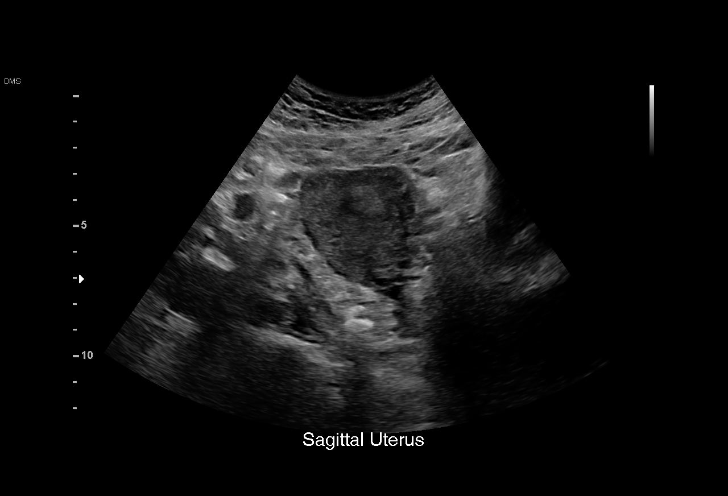
[im 14/61]
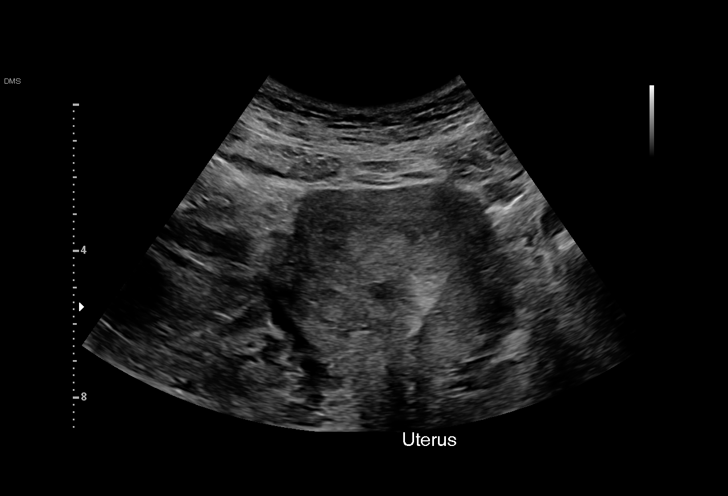
[im 18/61]
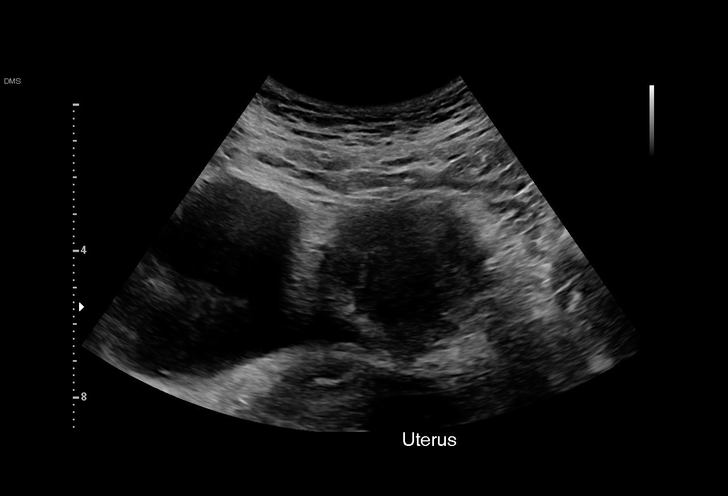
[im 23/61]
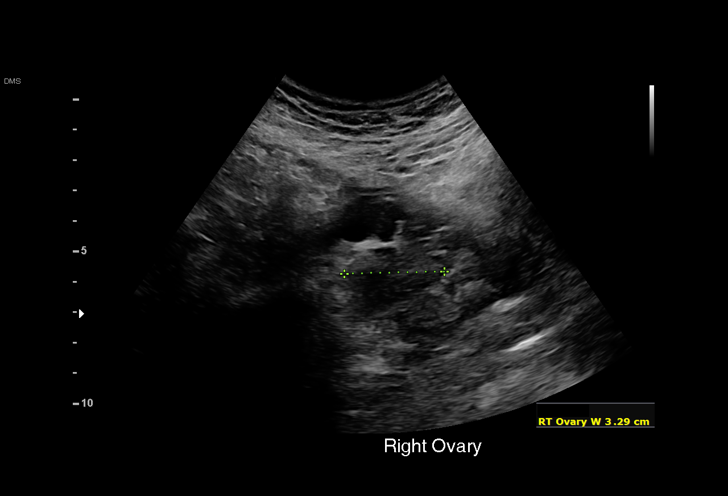
[im 27/61]
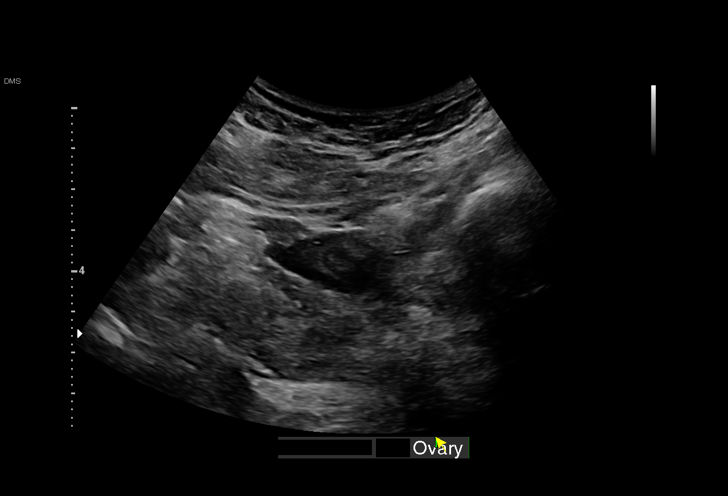
[im 32/61]
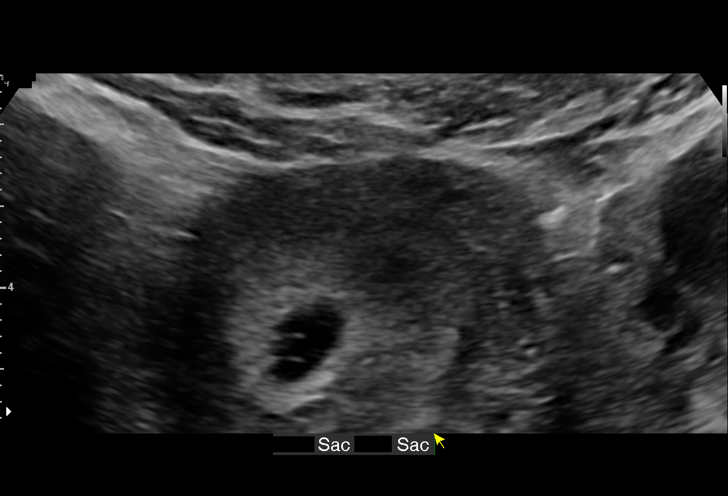
[im 34/61]
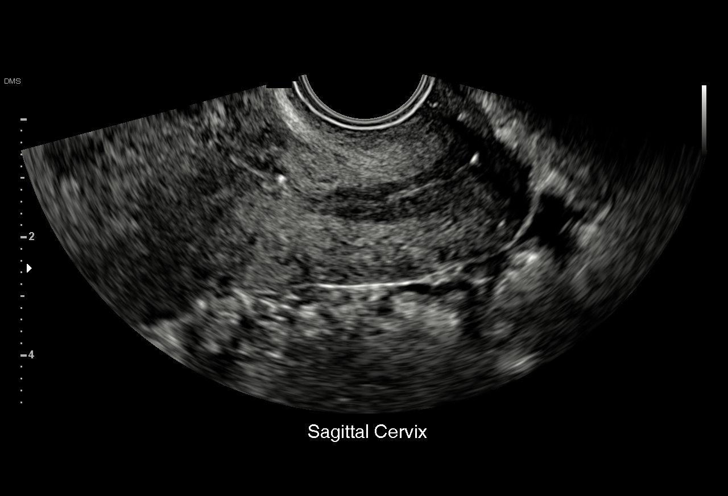
[im 38/61]
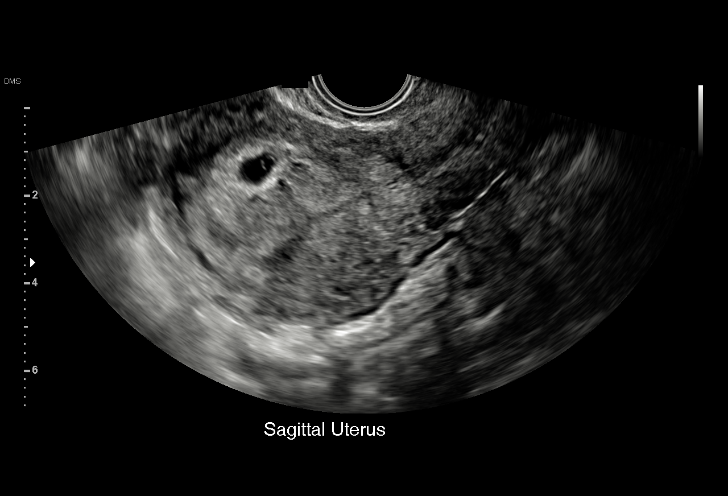
[im 43/61]
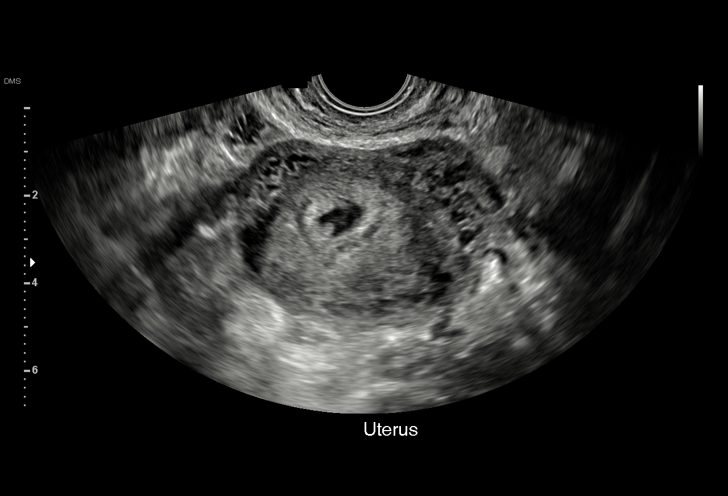
[im 47/61]
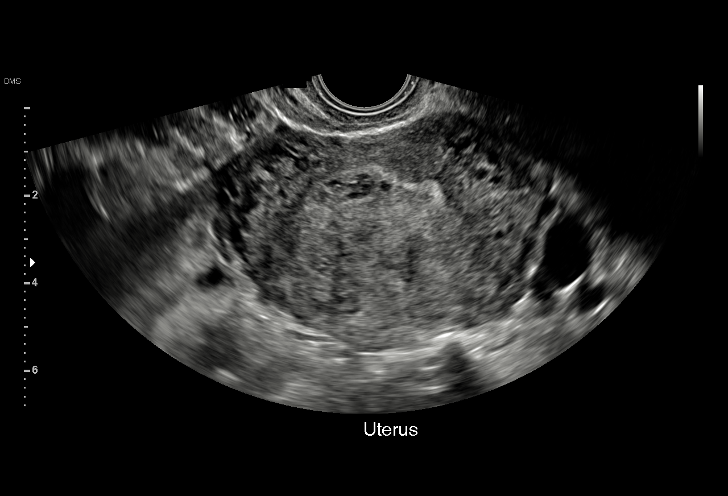
[im 52/61]
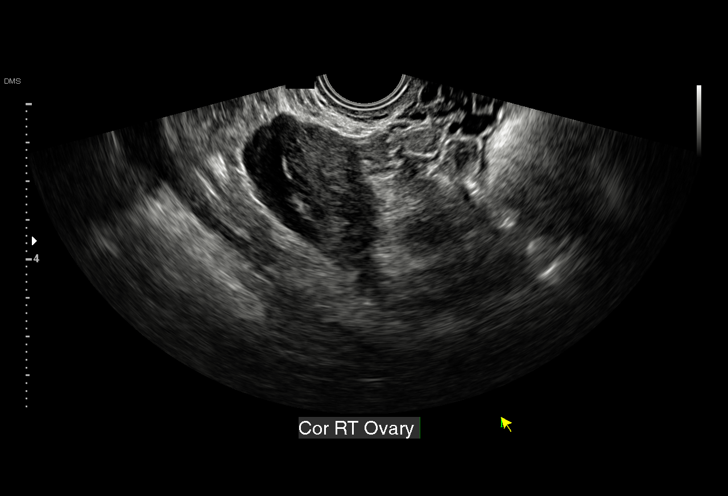
[im 56/61]
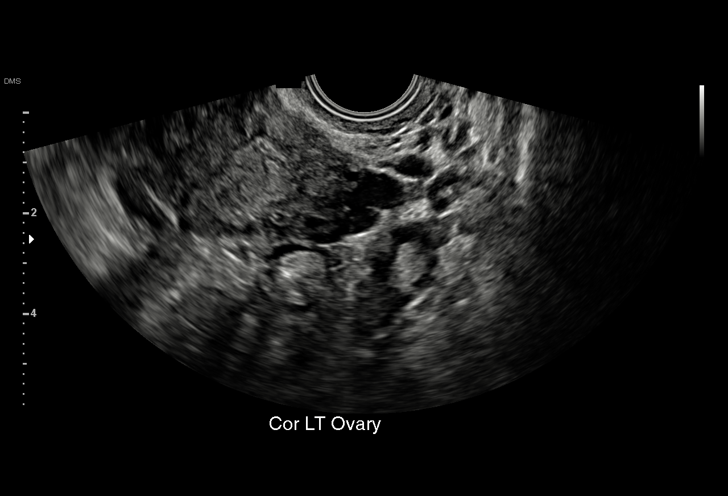
[im 61/61]
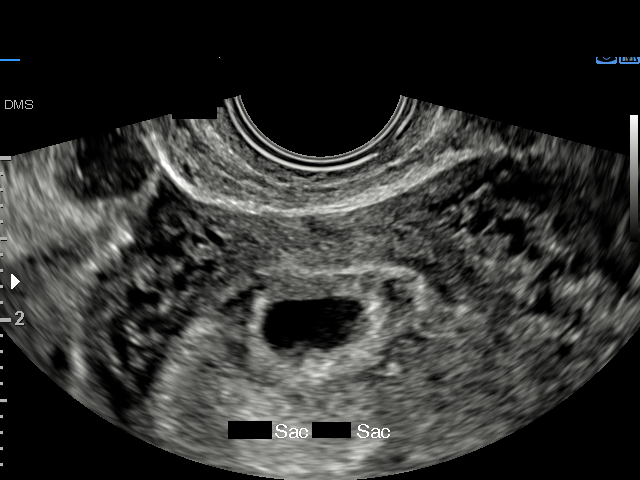

[15 of 28 positions shown; findings below may reference images not displayed]

FINDINGS: Intrauterine gestational sac: Single

Yolk sac:  Visualized.

Embryo:  Not visualized

MSD: 11.6 mm   5 w   6 d

Maternal uterus/adnexae:

Subchorionic hemorrhage: none visualized

Right ovary: Normal

Left ovary: Normal

Other :None

Free fluid:  None
IMPRESSION: 1. Probable early intrauterine gestational sac containing a yolk
sac, but no fetal pole, or cardiac activity yet visualized.
Recommend follow-up quantitative B-HCG levels and follow-up US in 14
days to assess viability. This recommendation follows SRU consensus
guidelines: Diagnostic Criteria for Nonviable Pregnancy Early in the
First Trimester. N Engl J Med 2259; [DATE].

## 2019-06-16 ENCOUNTER — Ambulatory Visit: Payer: Medicaid Other

## 2019-06-17 ENCOUNTER — Ambulatory Visit
Admission: RE | Admit: 2019-06-17 | Discharge: 2019-06-17 | Disposition: A | Discharge status: Other Acute Inpatient Hospital | Payer: Medicaid Other | Source: Ambulatory Visit

## 2019-06-17 NOTE — ED Provider Notes (Signed)
Patient has decided to come to the Urgent Care to be seen in person.    Sharion Balloon, NP 06/17/19 [phone]

## 2019-06-19 DIAGNOSIS — J309 Allergic rhinitis, unspecified: Secondary | ICD-10-CM | POA: Insufficient documentation

## 2019-06-30 DIAGNOSIS — E0590 Thyrotoxicosis, unspecified without thyrotoxic crisis or storm: Secondary | ICD-10-CM | POA: Insufficient documentation

## 2019-09-18 IMAGING — US US MFM OB DETAIL +14 WK
1 series · 14 of 28 positions shown · non-contrast
Comparison: none

[Series 1: us mfm ob detail +14 wk · 14 of 90 slices shown]
[im 4/90]
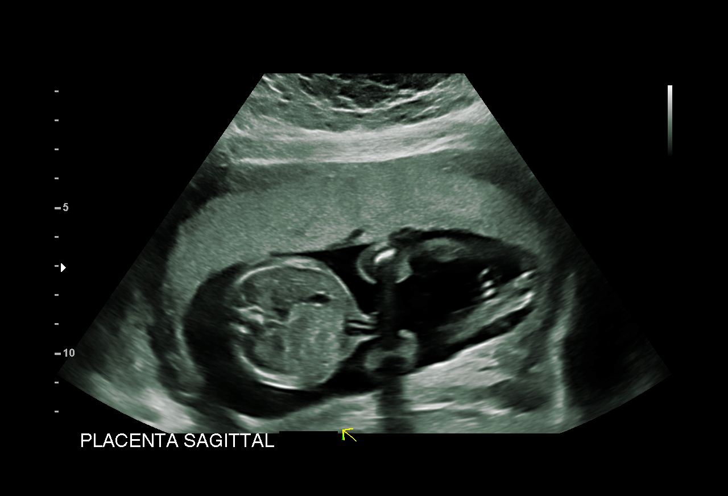
[im 10/90]
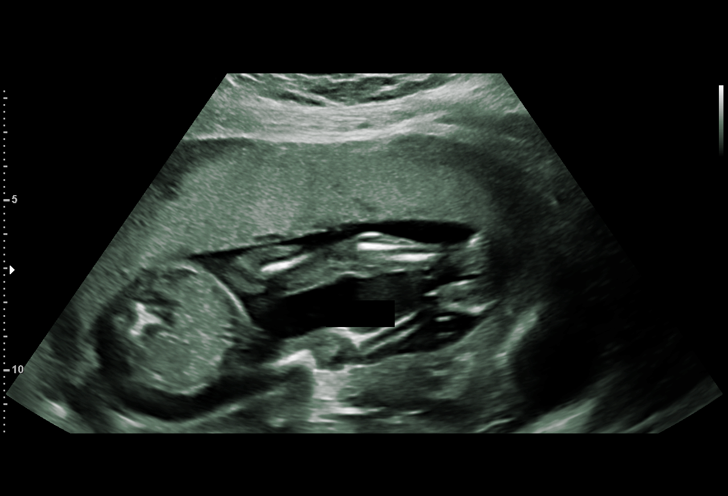
[im 17/90]
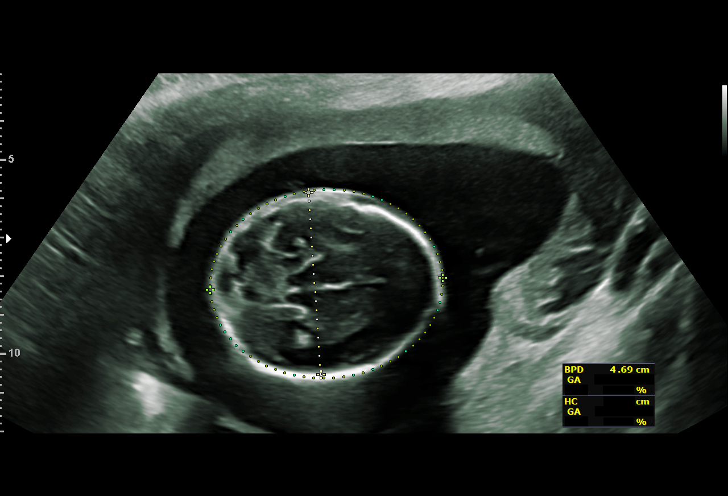
[im 24/90]
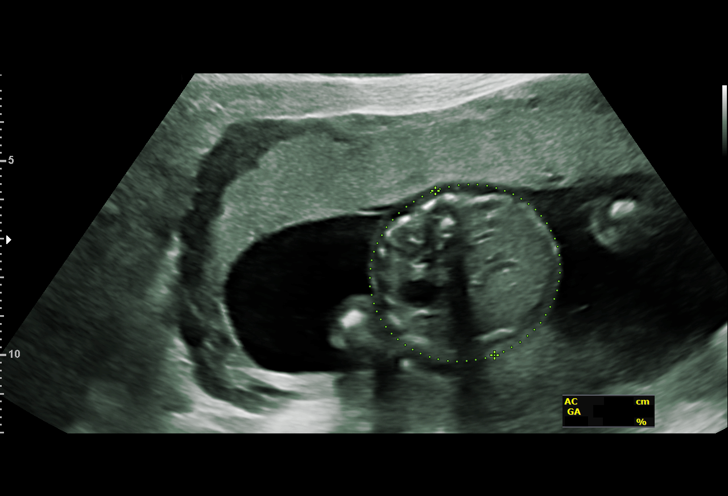
[im 30/90]
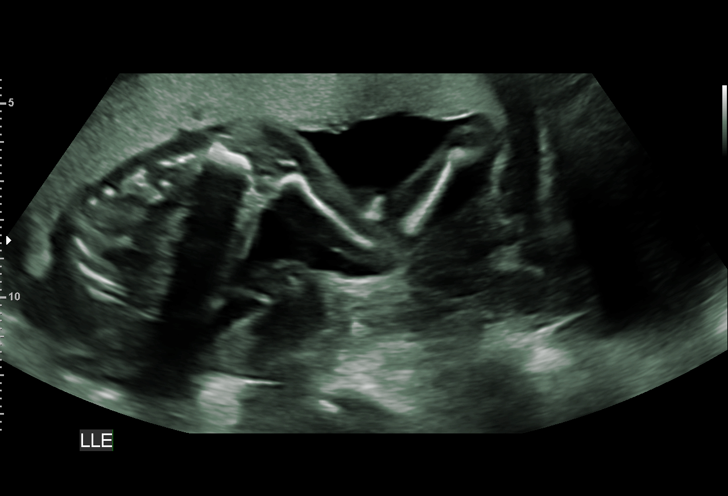
[im 37/90]
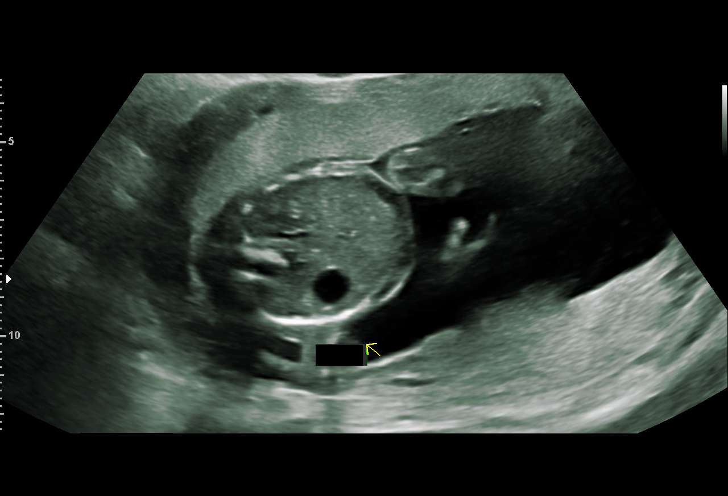
[im 43/90]
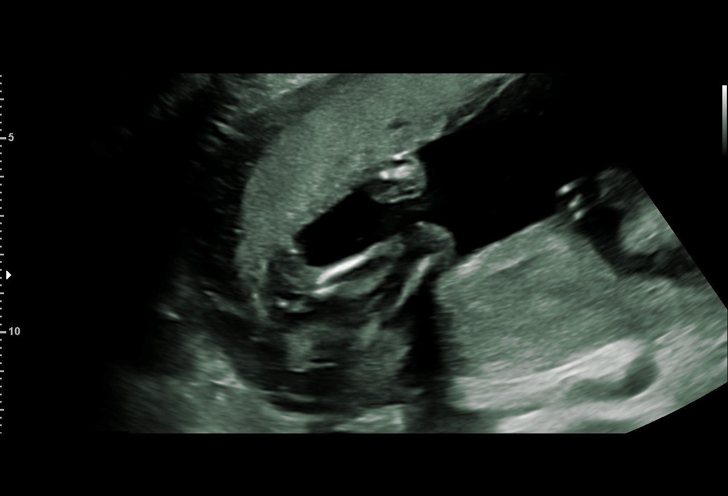
[im 50/90]
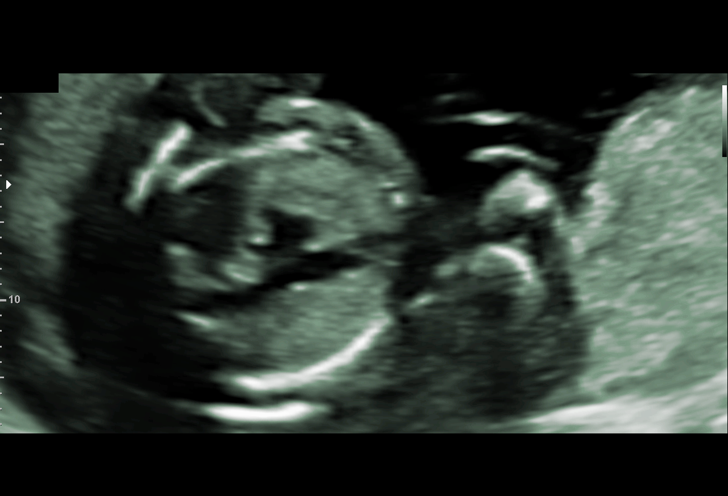
[im 57/90]
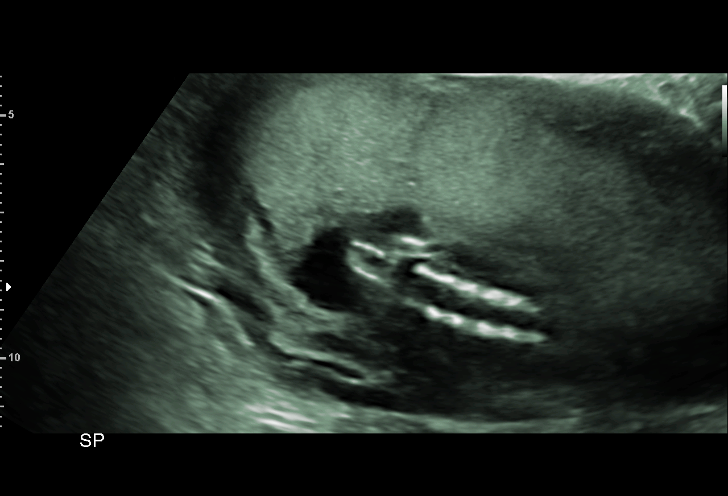
[im 63/90]
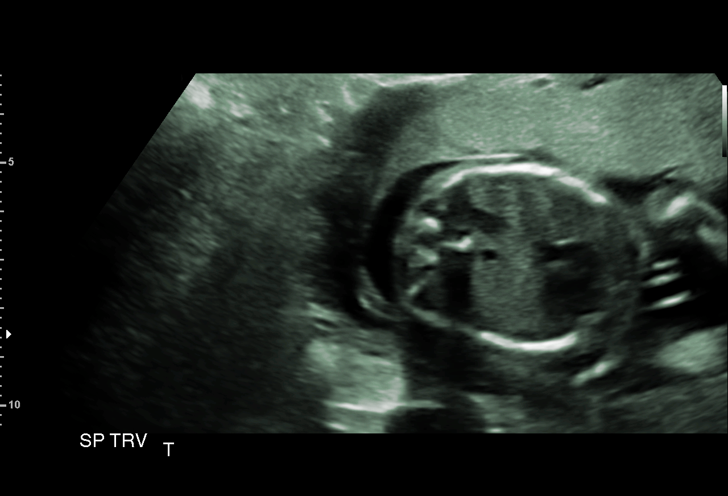
[im 70/90]
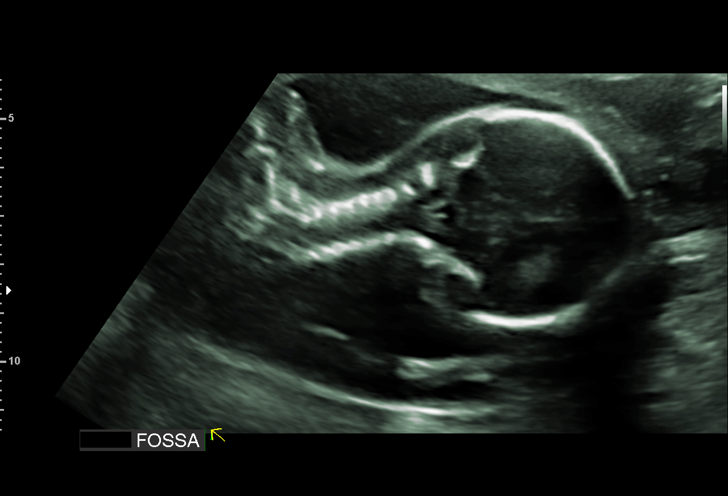
[im 76/90]
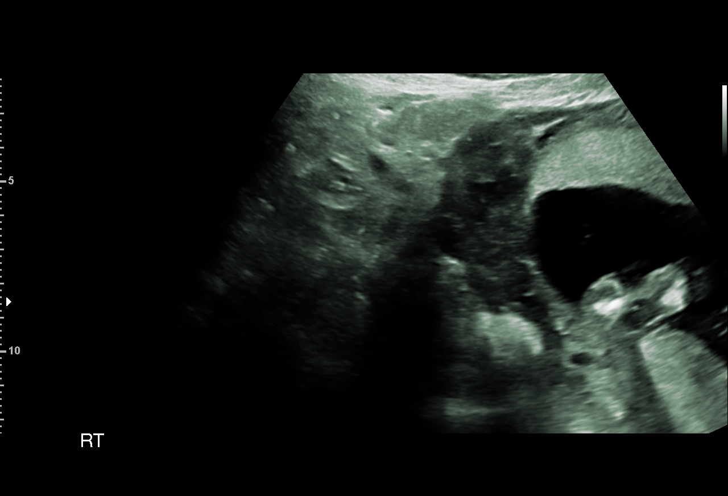
[im 83/90]
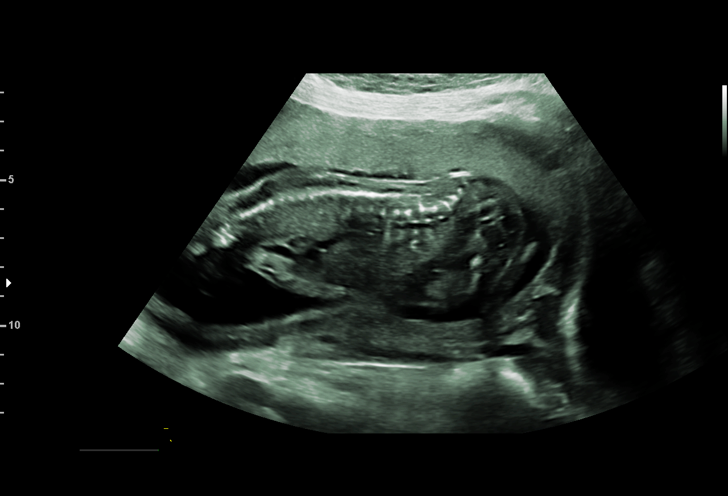
[im 90/90]
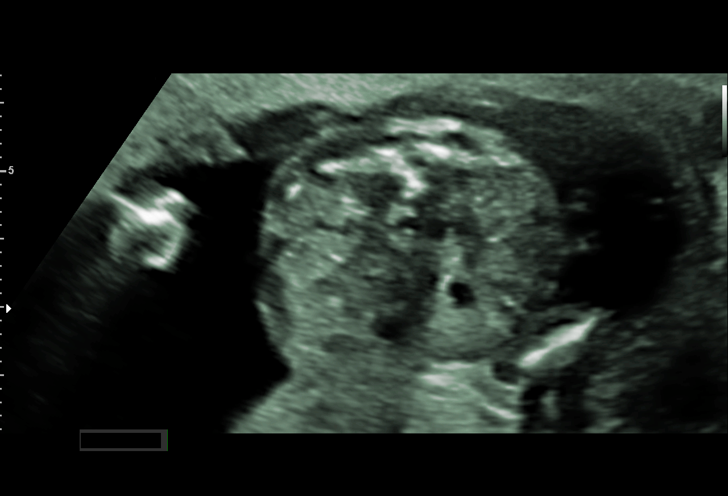

[14 of 28 positions shown; findings below may reference images not displayed]

----------------------------------------------------------------------

 ----------------------------------------------------------------------
Indications

  Encounter for antenatal screening for
  malformations
  19 weeks gestation of pregnancy
  Advanced maternal age multigravida 35+,
  second trimester
  Low risk NIPS
  Neg CF
  History of cesarean delivery, currently
  pregnant
 ----------------------------------------------------------------------
Fetal Evaluation

 Num Of Fetuses:         1
 Cardiac Activity:       Observed
 Presentation:           Variable
 Placenta:               Anterior
 P. Cord Insertion:      Visualized, central

 Amniotic Fluid
 AFI FV:      Within normal limits

                             Largest Pocket(cm)

Biometry

 BPD:      46.3  mm     G. Age:  20w 0d         87  %    CI:        73.05   %    70 - 86
                                                         FL/HC:      18.4   %    16.1 -
 HC:      172.2  mm     G. Age:  19w 6d         78  %    HC/AC:      1.21        1.09 -
 AC:      142.5  mm     G. Age:  19w 4d         66  %    FL/BPD:     68.5   %
 FL:       31.7  mm     G. Age:  19w 6d         74  %    FL/AC:      22.2   %    20 - 24
 HUM:      30.7  mm     G. Age:  20w 1d         80  %
 CER:      20.2  mm     G. Age:  19w 1d         55  %
 NFT:       4.7  mm

 CM:        3.5  mm

 Est. FW:     310  gm    0 lb 11 oz      56  %
OB History

 Gravidity:    8         Term:   2         SAB:   2
 TOP:          3        Living:  2
Gestational Age

 LMP:           19w 0d        Date:  05/29/18                 EDD:   03/05/19
 U/S Today:     19w 6d                                        EDD:   02/27/19
 Best:          19w 0d     Det. By:  LMP  (05/29/18)          EDD:   03/05/19
Anatomy

 Cranium:               Appears normal         LVOT:                   Appears normal
 Cavum:                 Appears normal         Aortic Arch:            Appears normal
 Ventricles:            Appears normal         Ductal Arch:            Appears normal
 Choroid Plexus:        Appears normal         Diaphragm:              Appears normal
 Cerebellum:            Appears normal         Abdomen:                Appears normal
 Posterior Fossa:       Appears normal         Abdominal Wall:         Appears nml (cord
                                                                       insert, abd wall)
 Nuchal Fold:           Appears normal         Cord Vessels:           Appears normal (3
                                                                       vessel cord)
 Face:                  Not well visualized    Kidneys:                Appear normal
 Lips:                  Appears normal         Bladder:                Appears normal
 Thoracic:              Appears normal         Spine:                  Not well visualized
 Heart:                 Appears normal         Upper Extremities:      Appears normal
                        (4CH, axis, and
                        situs)
 RVOT:                  Appears normal         Lower Extremities:      Appears normal

 Other:  Male gender. Heels visualized.
Cervix Uterus Adnexa

 Cervix
 Length:            3.2  cm.
 Normal appearance by transabdominal scan.

 Left Ovary
 Within normal limits.

 Right Ovary
 Not visualized.
Impression

 Normal interval growth.  No ultrasonic evidence of structural
 fetal anomalies.
 Suboptimal views of the fetal spine anatomy was obtained
 secondary to fetal position.
Recommendations

 Follow up in 4 weeks.

## 2019-11-25 MED ORDER — VALACYCLOVIR HCL 1 G PO TABS: 1000.0000 mg | ORAL_TABLET | Freq: Two times a day (BID) | ORAL | 6 refills | Status: DC

## 2020-01-12 DIAGNOSIS — M51369 Other intervertebral disc degeneration, lumbar region without mention of lumbar back pain or lower extremity pain: Secondary | ICD-10-CM | POA: Insufficient documentation

## 2020-01-12 DIAGNOSIS — M5136 Other intervertebral disc degeneration, lumbar region: Secondary | ICD-10-CM | POA: Insufficient documentation

## 2020-12-01 ENCOUNTER — Encounter: Payer: Self-pay | Admitting: Physician Assistant

## 2020-12-01 ENCOUNTER — Telehealth: Payer: Medicaid Other | Admitting: Physician Assistant

## 2020-12-01 DIAGNOSIS — B9689 Other specified bacterial agents as the cause of diseases classified elsewhere: Secondary | ICD-10-CM

## 2020-12-01 DIAGNOSIS — J208 Acute bronchitis due to other specified organisms: Secondary | ICD-10-CM

## 2020-12-01 MED ORDER — ALBUTEROL SULFATE HFA 108 (90 BASE) MCG/ACT IN AERS: 1.0000 | INHALATION_SPRAY | Freq: Four times a day (QID) | RESPIRATORY_TRACT | 0 refills | Status: DC | PRN

## 2020-12-01 MED ORDER — PREDNISONE 10 MG (21) PO TBPK: ORAL_TABLET | ORAL | 0 refills | Status: DC

## 2020-12-01 MED ORDER — AZITHROMYCIN 250 MG PO TABS: ORAL_TABLET | ORAL | 0 refills | Status: DC

## 2020-12-01 MED ORDER — BENZONATATE 100 MG PO CAPS: 100.0000 mg | ORAL_CAPSULE | Freq: Three times a day (TID) | ORAL | 0 refills | Status: DC | PRN

## 2020-12-01 NOTE — Patient Instructions (Signed)

## 2020-12-01 NOTE — Progress Notes (Signed)
[name], Amy [name] are scheduled for a virtual visit with your provider today.    Just as we do with appointments in the office, we must obtain your consent to participate.  Your consent will be active for this visit and any virtual visit you may have with one of our providers in the next 365 days.    If you have a MyChart account, I can also send a copy of this consent to you electronically.  All virtual visits are billed to your insurance company just like a traditional visit in the office.  As this is a virtual visit, video technology does not allow for your provider to perform a traditional examination.  This may limit your provider's ability to fully assess your condition.  If your provider identifies any concerns that need to be evaluated in person or the need to arrange testing such as labs, EKG, etc, we will make arrangements to do so.    Although advances in technology are sophisticated, we cannot ensure that it will always work on either your end or our end.  If the connection with a video visit is poor, we may have to switch to a telephone visit.  With either a video or telephone visit, we are not always able to ensure that we have a secure connection.   I need to obtain your verbal consent now.   Are you willing to proceed with your visit today?   Amy [name] has provided verbal consent on 12/01/2020 for a virtual visit (video or telephone).   [name], PA-C 12/01/2020  3:31 PM    MyChart Video Visit    Virtual Visit via Video Note   This visit type was conducted due to national recommendations for restrictions regarding the COVID-19 Pandemic (e.g. social distancing) in an effort to limit this patient's exposure and mitigate transmission in our community. This patient is at least at moderate risk for complications without adequate follow up. This format is felt to be most appropriate for this patient at this time. Physical exam was limited by quality of the video and audio  technology used for the visit.   Patient location: Parked safely in car Provider location: Home office in [place]  I discussed the limitations of evaluation and management by telemedicine and the availability of in person appointments. The patient expressed understanding and agreed to proceed.  Patient: Amy [name]   DOB: 1981/05/30   40 y.o. Female  MRN: [id number] Visit Date: 12/01/2020  Today's healthcare provider: [name], PA-C   No chief complaint on file.  Subjective    Cough This is a new problem. The current episode started in the past 7 days (Friday, 11/26/20). The problem has been unchanged. The cough is productive of purulent sputum (started discolored and is now changing to clear). Associated symptoms include chills, a fever, shortness of breath and wheezing. Pertinent negatives include no postnasal drip or rhinorrhea. The symptoms are aggravated by lying down. Treatments tried: Mucinex sinus max. The treatment provided no relief.    Took at home covid test on Monday, 11/29/20, and was negative  Patient Active Problem List   Diagnosis Date Noted  . History of cesarean delivery 08/09/2018  . 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 CONSTIPATION 12/16/2008  . OBESITY 10/12/2008  . MIGRAINE, COMMON 09/03/2008   Past Medical History:  Diagnosis Date  . HSV infection   . Migraine   . Vaginal Pap smear, abnormal  Medications: Outpatient Medications Prior to Visit  Medication Sig  . fluticasone (FLONASE) 50 MCG/ACT nasal spray Place 2 sprays into both nostrils 2 (two) times daily as needed for allergies or rhinitis.  . Fluticasone Propionate (FLONASE NA) Place into the nose.  . ibuprofen (ADVIL) 600 MG tablet Take 1 tablet (600 mg total) by mouth every 6 (six) hours as needed.  . prenatal vitamin w/FE, FA (PRENATAL 1 + 1) 27-1 MG TABS tablet Take 1 tablet by mouth daily at 12 noon.  . sodium chloride (OCEAN) 0.65 % SOLN nasal spray  Place 1-2 sprays into both nostrils as needed for congestion. Reported on 10/07/2015  . valACYclovir (VALTREX) 1000 MG tablet Take 1 tablet (1,000 mg total) by mouth 2 (two) times daily.   No facility-administered medications prior to visit.    Review of Systems  Constitutional: Positive for chills and fever.  HENT: Positive for congestion and voice change. Negative for postnasal drip, rhinorrhea, sinus pressure and sinus pain.   Respiratory: Positive for cough, chest tightness, shortness of breath and wheezing.   Cardiovascular: Negative.   Neurological: Negative.     Last CBC Lab Results  Component Value Date   WBC 7.4 02/12/2019   HGB 11.9 (L) 02/12/2019   HCT 35.3 (L) 02/12/2019   MCV 89.8 02/12/2019   MCH 30.3 02/12/2019   RDW 14.3 02/12/2019   PLT 186 02/12/2019   Last metabolic panel Lab Results  Component Value Date   GLUCOSE 80 02/12/2019   NA 135 02/12/2019   K 3.9 02/12/2019   CL 107 02/12/2019   CO2 19 (L) 02/12/2019   BUN 11 02/12/2019   CREATININE 0.59 02/12/2019   GFRNONAA >60 02/12/2019   GFRAA >60 02/12/2019   CALCIUM 8.9 02/12/2019   PROT 6.7 02/12/2019   ALBUMIN 2.9 (L) 02/12/2019   BILITOT 0.4 02/12/2019   ALKPHOS 102 02/12/2019   AST 21 02/12/2019   ALT 17 02/12/2019   ANIONGAP 9 02/12/2019      Objective    There were no vitals taken for this visit. BP Readings from Last 3 Encounters:  03/27/19 117/69  02/15/19 (!) 91/47  02/11/19 (!) 130/91   Wt Readings from Last 3 Encounters:  03/27/19 224 lb 9.6 oz (101.9 kg)  02/12/19 247 lb 9.2 oz (112.3 kg)  02/05/19 247 lb 8 oz (112.3 kg)      Physical Exam Vitals reviewed.  Constitutional:      General: She is not in acute distress.    Appearance: Normal appearance. She is well-developed. She is not ill-appearing.  HENT:     Head: Normocephalic and atraumatic.  Pulmonary:     Effort: Pulmonary effort is normal. No respiratory distress (dry cough heard x 1; no difficulties talking in  full, coherent sentences).  Musculoskeletal:     Cervical back: Normal range of motion and neck supple.  Neurological:     Mental Status: She is alert.  Psychiatric:        Mood and Affect: Mood normal.        Behavior: Behavior normal.        Thought Content: Thought content normal.        Judgment: Judgment normal.        Assessment & Plan     1. Acute bacterial bronchitis - Worsening.  - Will treat with zpak, prednisone, albuterol inhaler and tessalon perles.  - Push fluids.  - Rest.  - Seek in-person evaluation if worsening or not improving with treatment.  -  azithromycin (ZITHROMAX) 250 MG tablet; Take 2 tablets PO on day one, and one tablet PO daily thereafter until completed.  Dispense: 6 tablet; Refill: 0 - albuterol (PROAIR HFA) 108 (90 Base) MCG/ACT inhaler; Inhale 1-2 puffs into the lungs every 6 (six) hours as needed for wheezing or shortness of breath.  Dispense: 8 g; Refill: 0 - predniSONE (STERAPRED UNI-PAK 21 TAB) 10 MG (21) TBPK tablet; 6 day taper; take as directed on package instructions  Dispense: 21 tablet; Refill: 0 - benzonatate (TESSALON) 100 MG capsule; Take 1 capsule (100 mg total) by mouth 3 (three) times daily as needed.  Dispense: 30 capsule; Refill: 0   No follow-ups on file.     I discussed the assessment and treatment plan with the patient. The patient was provided an opportunity to ask questions and all were answered. The patient agreed with the plan and demonstrated an understanding of the instructions.   The patient was advised to call back or seek an in-person evaluation if the symptoms worsen or if the condition fails to improve as anticipated.  I provided 15 minutes of face-to-face time during this encounter via MyChart Video enabled encounter.   Reine Just [facility] Tele[facility] [phone] (phone) [phone] (fax)  [facility]

## 2020-12-21 ENCOUNTER — Other Ambulatory Visit: Payer: Self-pay | Admitting: Physician Assistant

## 2020-12-21 DIAGNOSIS — J208 Acute bronchitis due to other specified organisms: Secondary | ICD-10-CM

## 2021-06-08 ENCOUNTER — Telehealth: Payer: Medicaid Other | Admitting: Physician Assistant

## 2021-06-08 DIAGNOSIS — J019 Acute sinusitis, unspecified: Secondary | ICD-10-CM

## 2021-06-08 DIAGNOSIS — B9689 Other specified bacterial agents as the cause of diseases classified elsewhere: Secondary | ICD-10-CM

## 2021-06-08 MED ORDER — AMOXICILLIN-POT CLAVULANATE 875-125 MG PO TABS: 1.0000 | ORAL_TABLET | Freq: Two times a day (BID) | ORAL | 0 refills | Status: DC

## 2021-06-08 NOTE — Progress Notes (Signed)
Virtual Visit Consent   Amy [name], you are scheduled for a virtual visit with a [facility] provider today.     Just as with appointments in the office, your consent must be obtained to participate.  Your consent will be active for this visit and any virtual visit you may have with one of our providers in the next 365 days.     If you have a MyChart account, a copy of this consent can be sent to you electronically.  All virtual visits are billed to your insurance company just like a traditional visit in the office.    As this is a virtual visit, video technology does not allow for your provider to perform a traditional examination.  This may limit your provider's ability to fully assess your condition.  If your provider identifies any concerns that need to be evaluated in person or the need to arrange testing (such as labs, EKG, etc.), we will make arrangements to do so.     Although advances in technology are sophisticated, we cannot ensure that it will always work on either your end or our end.  If the connection with a video visit is poor, the visit may have to be switched to a telephone visit.  With either a video or telephone visit, we are not always able to ensure that we have a secure connection.     I need to obtain your verbal consent now.   Are you willing to proceed with your visit today?    Amy [name] has provided verbal consent on 06/08/2021 for a virtual visit (video or telephone).   Amy [name], [place]   Date: 06/08/2021 10:34 AM   Virtual Visit via Video Note   I, Amy [name], connected with  Amy [name]  ([id number], 1980/10/19) on 06/08/21 at 10:30 AM EST by a video-enabled telemedicine application and verified that I am speaking with the correct person using two identifiers.  Location: Patient: Virtual Visit Location Patient: Home Provider: Virtual Visit Location Provider: Home Office   I discussed the limitations of evaluation and management  by telemedicine and the availability of in person appointments. The patient expressed understanding and agreed to proceed.    History of Present Illness: Amy [name] is a 40 y.o. who identifies as a female who was assigned female at birth, and is being seen today for possible sinusitis. Notes symptoms starting last week with nasal and head congestion. Unsure of fever. Denies any substantial chest congestion but occasional cough. Nasal drainage is thick and green and odorous. Denies recent travel.  Has been taking OTC decongestants for symptoms.   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   CONSTIPATION 12/16/2008   OBESITY 10/12/2008   MIGRAINE, COMMON 09/03/2008    Allergies:  Allergies  Allergen Reactions   Shrimp [Shellfish Allergy] Swelling    Swollen Lips   Medications:  Current Outpatient Medications:    fluticasone (FLONASE) 50 MCG/ACT nasal spray, Place 2 sprays into both nostrils 2 (two) times daily as needed for allergies or rhinitis., Disp: 9.9 mL, Rfl: 0   ibuprofen (ADVIL) 600 MG tablet, Take 1 tablet (600 mg total) by mouth every 6 (six) hours as needed., Disp: 30 tablet, Rfl: 0   prenatal vitamin w/FE, FA (PRENATAL 1 + 1) 27-1 MG TABS tablet, Take 1 tablet by mouth  daily at 12 noon., Disp: 30 tablet, Rfl: 11   sodium chloride (OCEAN) 0.65 % SOLN nasal spray, Place 1-2 sprays into both nostrils as needed for congestion. Reported on 10/07/2015, Disp: , Rfl:   Observations/Objective: Patient is well-developed, well-nourished in no acute distress.  Resting comfortably at home.  Head is normocephalic, atraumatic.  No labored breathing. Speech is clear and coherent with logical content.  Patient is alert and oriented at baseline.   Assessment and Plan: 1. Acute bacterial sinusitis Rx Augmentin.  Increase fluids.  Rest.  Saline nasal spray.   Probiotic.  Mucinex as directed.  Humidifier in bedroom.  Call or return to clinic if symptoms are not improving.   Follow Up Instructions: I discussed the assessment and treatment plan with the patient. The patient was provided an opportunity to ask questions and all were answered. The patient agreed with the plan and demonstrated an understanding of the instructions.  A copy of instructions were sent to the patient via MyChart unless otherwise noted below.   The patient was advised to call back or seek an in-person evaluation if the symptoms worsen or if the condition fails to improve as anticipated.  Time:  I spent 10 minutes with the patient via telehealth technology discussing the above problems/concerns.    Amy Climes, PA-C

## 2021-06-08 NOTE — Patient Instructions (Signed)
[name], thank you for joining [name], PA-C for today's virtual visit.  While this provider is not your primary care provider (PCP), if your PCP is located in our provider database this encounter information will be shared with them immediately following your visit.  Consent: (Patient) Amy [name] provided verbal consent for this virtual visit at the beginning of the encounter.  Current Medications:  Current Outpatient Medications:    albuterol (PROAIR HFA) 108 (90 Base) MCG/ACT inhaler, Inhale 1-2 puffs into the lungs every 6 (six) hours as needed for wheezing or shortness of breath., Disp: 8 g, Rfl: 0   azithromycin (ZITHROMAX) 250 MG tablet, Take 2 tablets PO on day one, and one tablet PO daily thereafter until completed., Disp: 6 tablet, Rfl: 0   benzonatate (TESSALON) 100 MG capsule, Take 1 capsule (100 mg total) by mouth 3 (three) times daily as needed., Disp: 30 capsule, Rfl: 0   fluticasone (FLONASE) 50 MCG/ACT nasal spray, Place 2 sprays into both nostrils 2 (two) times daily as needed for allergies or rhinitis., Disp: 9.9 mL, Rfl: 0   Fluticasone Propionate (FLONASE NA), Place into the nose., Disp: , Rfl:    ibuprofen (ADVIL) 600 MG tablet, Take 1 tablet (600 mg total) by mouth every 6 (six) hours as needed., Disp: 30 tablet, Rfl: 0   predniSONE (STERAPRED UNI-PAK 21 TAB) 10 MG (21) TBPK tablet, 6 day taper; take as directed on package instructions, Disp: 21 tablet, Rfl: 0   prenatal vitamin w/FE, FA (PRENATAL 1 + 1) 27-1 MG TABS tablet, Take 1 tablet by mouth daily at 12 noon., Disp: 30 tablet, Rfl: 11   sodium chloride (OCEAN) 0.65 % SOLN nasal spray, Place 1-2 sprays into both nostrils as needed for congestion. Reported on 10/07/2015, Disp: , Rfl:    valACYclovir (VALTREX) 1000 MG tablet, Take 1 tablet (1,000 mg total) by mouth 2 (two) times daily., Disp: 10 tablet, Rfl: 6   Medications ordered in this encounter:  No orders of the defined types were placed in  this encounter.    *If you need refills on other medications prior to your next appointment, please contact your pharmacy*  Follow-Up: Call back or seek an in-person evaluation if the symptoms worsen or if the condition fails to improve as anticipated.  Other Instructions Please take antibiotic as directed.  Increase fluid intake.  Use Saline nasal spray.  Take a daily multivitamin. Please continue use of Mucinex-D.  Place a humidifier in the bedroom.  Please call or return clinic if symptoms are not improving.  Sinusitis Sinusitis is redness, soreness, and swelling (inflammation) of the paranasal sinuses. Paranasal sinuses are air pockets within the bones of your face (beneath the eyes, the middle of the forehead, or above the eyes). In healthy paranasal sinuses, mucus is able to drain out, and air is able to circulate through them by way of your nose. However, when your paranasal sinuses are inflamed, mucus and air can become trapped. This can allow bacteria and other germs to grow and cause infection. Sinusitis can develop quickly and last only a short time (acute) or continue over a long period (chronic). Sinusitis that lasts for more than 12 weeks is considered chronic.  CAUSES  Causes of sinusitis include: Allergies. Structural abnormalities, such as displacement of the cartilage that separates your nostrils (deviated septum), which can decrease the air flow through your nose and sinuses and affect sinus drainage. Functional abnormalities, such as when the small hairs (cilia) that  line your sinuses and help remove mucus do not work properly or are not present. SYMPTOMS  Symptoms of acute and chronic sinusitis are the same. The primary symptoms are pain and pressure around the affected sinuses. Other symptoms include: Upper toothache. Earache. Headache. Bad breath. Decreased sense of smell and taste. A cough, which worsens when you are lying flat. Fatigue. Fever. Thick drainage from  your nose, which often is green and may contain pus (purulent). Swelling and warmth over the affected sinuses. DIAGNOSIS  Your caregiver will perform a physical exam. During the exam, your caregiver may: Look in your nose for signs of abnormal growths in your nostrils (nasal polyps). Tap over the affected sinus to check for signs of infection. View the inside of your sinuses (endoscopy) with a special imaging device with a light attached (endoscope), which is inserted into your sinuses. If your caregiver suspects that you have chronic sinusitis, one or more of the following tests may be recommended: Allergy tests. Nasal culture A sample of mucus is taken from your nose and sent to a lab and screened for bacteria. Nasal cytology A sample of mucus is taken from your nose and examined by your caregiver to determine if your sinusitis is related to an allergy. TREATMENT  Most cases of acute sinusitis are related to a viral infection and will resolve on their own within 10 days. Sometimes medicines are prescribed to help relieve symptoms (pain medicine, decongestants, nasal steroid sprays, or saline sprays).  However, for sinusitis related to a bacterial infection, your caregiver will prescribe antibiotic medicines. These are medicines that will help kill the bacteria causing the infection.  Rarely, sinusitis is caused by a fungal infection. In theses cases, your caregiver will prescribe antifungal medicine. For some cases of chronic sinusitis, surgery is needed. Generally, these are cases in which sinusitis recurs more than 3 times per year, despite other treatments. HOME CARE INSTRUCTIONS  Drink plenty of water. Water helps thin the mucus so your sinuses can drain more easily. Use a humidifier. Inhale steam 3 to 4 times a day (for example, sit in the bathroom with the shower running). Apply a warm, moist washcloth to your face 3 to 4 times a day, or as directed by your caregiver. Use saline nasal  sprays to help moisten and clean your sinuses. Take over-the-counter or prescription medicines for pain, discomfort, or fever only as directed by your caregiver. SEEK IMMEDIATE MEDICAL CARE IF: You have increasing pain or severe headaches. You have nausea, vomiting, or drowsiness. You have swelling around your face. You have vision problems. You have a stiff neck. You have difficulty breathing. MAKE SURE YOU:  Understand these instructions. Will watch your condition. Will get help right away if you are not doing well or get worse. Document Released: 06/12/2005 Document Revised: 09/04/2011 Document Reviewed: 06/27/2011 [facility] Patient Information 2014 [place], [place].    If you have been instructed to have an in-person evaluation today at a local Urgent Care facility, please use the link below. It will take you to a list of all of our available [facility], including address, phone number and hours of operation. Please do not delay care.  [facility]  If you or a family member do not have a primary care provider, use the link below to schedule a visit and establish care. When you choose a [facility] primary care physician or advanced practice provider, you gain a long-term partner in health. Find a Primary Care Provider  Learn more about [facility]'s in-office and virtual care options: [facility] - Get Care Now  

## 2021-08-16 ENCOUNTER — Telehealth: Payer: Medicaid Other | Admitting: Physician Assistant

## 2021-08-16 DIAGNOSIS — J019 Acute sinusitis, unspecified: Secondary | ICD-10-CM | POA: Diagnosis not present

## 2021-08-16 DIAGNOSIS — B9689 Other specified bacterial agents as the cause of diseases classified elsewhere: Secondary | ICD-10-CM | POA: Diagnosis not present

## 2021-08-16 MED ORDER — AMOXICILLIN-POT CLAVULANATE 875-125 MG PO TABS: 1.0000 | ORAL_TABLET | Freq: Two times a day (BID) | ORAL | 0 refills | Status: DC

## 2021-08-16 NOTE — Progress Notes (Signed)

## 2021-08-23 ENCOUNTER — Ambulatory Visit: Payer: Medicaid Other | Admitting: Obstetrics & Gynecology

## 2021-08-30 ENCOUNTER — Ambulatory Visit: Payer: Medicaid Other | Admitting: Obstetrics and Gynecology

## 2021-09-06 ENCOUNTER — Ambulatory Visit: Payer: Medicaid Other | Admitting: Obstetrics & Gynecology

## 2021-09-27 ENCOUNTER — Ambulatory Visit: Payer: Medicaid Other | Admitting: Obstetrics & Gynecology

## 2021-09-27 ENCOUNTER — Encounter: Payer: Medicaid Other | Admitting: Obstetrics & Gynecology

## 2021-12-25 ENCOUNTER — Telehealth: Payer: Medicaid Other | Admitting: Family

## 2021-12-25 DIAGNOSIS — H6990 Unspecified Eustachian tube disorder, unspecified ear: Secondary | ICD-10-CM

## 2021-12-25 DIAGNOSIS — J069 Acute upper respiratory infection, unspecified: Secondary | ICD-10-CM | POA: Diagnosis not present

## 2021-12-25 MED ORDER — FLUTICASONE PROPIONATE 50 MCG/ACT NA SUSP: 2.0000 | Freq: Every day | NASAL | 6 refills | Status: AC

## 2021-12-25 MED ORDER — BENZONATATE 100 MG PO CAPS: 100.0000 mg | ORAL_CAPSULE | Freq: Three times a day (TID) | ORAL | 0 refills | Status: DC | PRN

## 2021-12-25 NOTE — Progress Notes (Signed)

## 2022-06-27 ENCOUNTER — Encounter: Payer: Self-pay | Admitting: Obstetrics and Gynecology

## 2022-06-27 ENCOUNTER — Ambulatory Visit (INDEPENDENT_AMBULATORY_CARE_PROVIDER_SITE_OTHER): Payer: Medicaid Other | Admitting: Obstetrics and Gynecology

## 2022-06-27 VITALS — BP 128/82 | HR 84 | Ht 67.0 in | Wt 223.0 lb

## 2022-06-27 DIAGNOSIS — Z719 Counseling, unspecified: Secondary | ICD-10-CM

## 2022-06-27 DIAGNOSIS — N9069 Other specified hypertrophy of vulva: Secondary | ICD-10-CM | POA: Diagnosis not present

## 2022-06-27 NOTE — Progress Notes (Unsigned)
42 y.o. [id number] Domestic Partner Caucasian female here for NEW GYN. Pt has two holes in the labia symmetrical. They are bigger than they have been recently.    No prior piercing.  No prior labial procedures or drainage of abscess.  No pain.   Had 2 vaginal deliveries and one Cesarean section.  The Cesarean Section was done in between the vaginal deliveries.  She noticed the right labial hole years ago, and the left labial hold after the birth of her lat child.   Works as in Medical illustrator.  Has a 42 yo, 42 yo, and 60 yo children.  PCP:   None.  Patient's last menstrual period was 06/12/2022.     Period Cycle (Days): 28 Period Duration (Days): 4-5 Period Pattern: Regular Menstrual Flow: Heavy, Moderate Menstrual Control: Tampon, Maxi pad Dysmenorrhea: (!) Mild     Sexually active: Yes.    The current method of family planning is none.    Exercising: Yes.     Walk a mile everyday. Smoker:  no, former  Health Maintenance: Pap:  08/09/18 negative: HR HPV negative, 11/05/07 normal History of abnormal Pap:  yes MMG:  n/a Colonoscopy:  n/a BMD:   n/a  Result  n/a TDaP:  06/19/2018 Gardasil:   no   reports that she has quit smoking. She smoked an average of .5 packs per day. She uses smokeless tobacco. She reports that she does not drink alcohol and does not use drugs.  Past Medical History:  Diagnosis Date   HSV infection    Migraine    Vaginal Pap smear, abnormal     Past Surgical History:  Procedure Laterality Date   CESAREAN SECTION     DILATION AND EVACUATION N/A 09/21/2015   Procedure: DILATATION AND EVACUATION;  Surgeon: Donnamae Jude, MD;  Location: [facility];  Service: Gynecology;  Laterality: N/A;   THERAPEUTIC ABORTION     WISDOM TOOTH EXTRACTION      Current Outpatient Medications  Medication Sig Dispense Refill   fluticasone (FLONASE) 50 MCG/ACT nasal spray Place 2 sprays into both nostrils daily. 16 g 6   ibuprofen (ADVIL) 600 MG tablet Take 1 tablet (600 mg  total) by mouth every 6 (six) hours as needed. 30 tablet 0   sodium chloride (OCEAN) 0.65 % SOLN nasal spray Place 1-2 sprays into both nostrils as needed for congestion. Reported on 10/07/2015     valACYclovir (VALTREX) 500 MG tablet Take by mouth.     prenatal vitamin w/FE, FA (PRENATAL 1 + 1) 27-1 MG TABS tablet Take 1 tablet by mouth daily at 12 noon. (Patient not taking: Reported on 06/27/2022) 30 tablet 11   No current facility-administered medications for this visit.    Family History  Problem Relation Age of Onset   Cancer Father    Heart disease Maternal Aunt    Stroke Maternal Grandmother    Heart disease Maternal Grandfather     Review of Systems  All other systems reviewed and are negative.   Exam:   BP 128/82 (BP Location: Left Arm, Patient Position: Sitting, Cuff Size: Normal)   Pulse 84   Ht 5\' 7"  (1.702 m)   Wt 223 lb (101.2 kg)   LMP 06/12/2022   SpO2 98%   BMI 34.93 kg/m     General appearance: alert, cooperative and appears stated age   Pelvic: External genitalia:   4 mm circular opening of the bilateral labia minora.  No erythema, ulceration, or tenderness.  No abnormal inguinal nodes palpated.              Urethra:  normal appearing urethra with no masses, tenderness or lesions              Bartholins and Skenes: normal                 Vagina: normal appearing vagina with normal color and discharge, no lesions              Cervix: no lesions             Bimanual Exam:  Uterus:  normal size, contour, position, consistency, mobility, non-tender              Adnexa: no mass, fullness, tenderness            Chaperone was present for exam:  Raquel Sarna  Assessment:   Health education counseling.  Bilateral labial skin circular opening.  Appears chronic.  Likely due to vaginal delivery laceration.  No evidence of infection.   Plan: I recommended lubrication for sexual activity to reduce friction on the tissue.  No further evaluation needed. I do  not recommend surgical correction, as this may not result in better healing and resolution.  Return for annual exam and prn.   After visit summary provided.

## 2022-07-13 NOTE — Progress Notes (Unsigned)
42 y.o. [id number] Domestic Partner Caucasian female here for annual exam.    She is having heavier cycles since her last child was born 3.5 years ago.  Still doing some breast feeding.   She has a headache the day prior her menses start.  Has premenstrual symptoms the week prior to her periods:  emotional changes, anger.  Has taken antidepressants in the past in her 70s:  Lamictal, sleeping medication, Prozac.  States that her family and friends are aware of her changes. Family stress in multiple ways.  Took Wellbutrin in the past. Thinks one of her family members is allergic.  Has done counseling.   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   PCP:   None.  Patient's last menstrual period was 07/10/2022.     Period Cycle (Days): 28 Period Duration (Days): 4-5 Period Pattern: Regular Menstrual Flow: Moderate     Sexually active: Yes.  The current method of family planning is none.    Exercising: Yes.     Weight lifting. Smoker:  former  Health Maintenance: Pap:  08/09/18 neg: HR HPV neg, 11/05/07 normal History of abnormal Pap:  yes, remote past.  Thinks she had a LEEP in 2006.  MMG:  n/a Colonoscopy:  n/a BMD:   n/a  Result  n/a TDaP:  06/19/2018 Gardasil:   no Screening Labs:  PCP   reports that she has quit smoking. Her smoking use included cigarettes. She smoked an average of .5 packs per day. She uses smokeless tobacco. She reports that she does not drink alcohol and does not use drugs.  Past Medical History:  Diagnosis Date   HSV infection    type II   Migraine    without aura   Vaginal Pap smear, abnormal     Past Surgical History:  Procedure Laterality Date   CESAREAN SECTION     DILATION AND EVACUATION N/A 09/21/2015   Procedure: DILATATION AND EVACUATION;  Surgeon: Donnamae Jude, MD;  Location: [facility];  Service: Gynecology;  Laterality: N/A;   THERAPEUTIC ABORTION     WISDOM TOOTH EXTRACTION      Current Outpatient Medications  Medication Sig  Dispense Refill   fluticasone (FLONASE) 50 MCG/ACT nasal spray Place 2 sprays into both nostrils daily. 16 g 6   ibuprofen (ADVIL) 600 MG tablet Take 1 tablet (600 mg total) by mouth every 6 (six) hours as needed. 30 tablet 0   prenatal vitamin w/FE, FA (PRENATAL 1 + 1) 27-1 MG TABS tablet Take 1 tablet by mouth daily at 12 noon. 30 tablet 11   sodium chloride (OCEAN) 0.65 % SOLN nasal spray Place 1-2 sprays into both nostrils as needed for congestion. Reported on 10/07/2015     valACYclovir (VALTREX) 500 MG tablet Take 1 tablet (500 mg total) by mouth 2 (two) times daily. Take for 3 days as needed. 30 tablet 2   No current facility-administered medications for this visit.    Family History  Problem Relation Age of Onset   Cancer Father    Heart disease Maternal Aunt    Stroke Maternal Grandmother    Heart disease Maternal Grandfather     Review of Systems  All other systems reviewed and are negative.   Exam:   BP 126/80 (BP Location: Left Arm, Patient Position: Sitting, Cuff Size: Large)   Pulse 81   Ht 5' 8.5" (1.74 m)   Wt 222 lb (100.7 kg)   LMP 07/10/2022   SpO2 95%   BMI  33.26 kg/m     General appearance: alert, cooperative and appears stated age Head: normocephalic, without obvious abnormality, atraumatic Neck: no adenopathy, supple, symmetrical, trachea midline and thyroid normal to inspection and palpation Lungs: clear to auscultation bilaterally Breasts: normal appearance, no masses or tenderness, No nipple retraction or dimpling, No nipple discharge or bleeding, No axillary adenopathy Heart: regular rate and rhythm Abdomen: soft, non-tender; no masses, no organomegaly Extremities: extremities normal, atraumatic, no cyanosis or edema Skin: skin color, texture, turgor normal. No rashes or lesions Lymph nodes: cervical, supraclavicular, and axillary nodes normal. Neurologic: grossly normal  Pelvic: External genitalia:  no lesions              No abnormal inguinal  nodes palpated.              Urethra:  normal appearing urethra with no masses, tenderness or lesions              Bartholins and Skenes: normal                 Vagina: normal appearing vagina with normal color and discharge, no lesions              Cervix: no lesions              Pap taken: yes Bimanual Exam:  Uterus:  normal size, contour, position, consistency, mobility, non-tender              Adnexa: no mass, fullness, tenderness              Rectal exam: yes.  Confirms.              Anus:  normal sphincter tone, no lesions  Chaperone was present for exam:  Raquel Sarna  Assessment:   Well woman visit with gynecologic exam. Bilateral labia openings. Possible history of LEEP. Hx HSV II. Migraine HA without aura.  Situational stress.  PMS.   Plan: Mammogram screening discussed. Self breast awareness reviewed. Pap and HR HPV as above. Guidelines for Calcium, Vitamin D, regular exercise program including cardiovascular and weight bearing exercise. IUDs and permanent contraception discussed. Rx for Valtrex.  Zoloft 50 mg daily.  I discussed potential side effects.  Fu in 6 weeks.  Follow up annually and prn.   After visit summary provided.   PMDD and situational stress discussed.  Rx for Zoloft.

## 2022-07-25 ENCOUNTER — Encounter: Payer: Self-pay | Admitting: Obstetrics and Gynecology

## 2022-07-25 ENCOUNTER — Ambulatory Visit (INDEPENDENT_AMBULATORY_CARE_PROVIDER_SITE_OTHER): Payer: Medicaid Other | Admitting: Obstetrics and Gynecology

## 2022-07-25 ENCOUNTER — Other Ambulatory Visit (HOSPITAL_COMMUNITY)
Admission: RE | Admit: 2022-07-25 | Discharge: 2022-07-25 | Disposition: A | Discharge status: Home / Self Care | Payer: Medicaid Other | Source: Ambulatory Visit | Attending: Obstetrics and Gynecology | Admitting: Obstetrics and Gynecology

## 2022-07-25 VITALS — BP 126/80 | HR 81 | Ht 68.5 in | Wt 222.0 lb

## 2022-07-25 DIAGNOSIS — Z01419 Encounter for gynecological examination (general) (routine) without abnormal findings: Secondary | ICD-10-CM

## 2022-07-25 DIAGNOSIS — Z124 Encounter for screening for malignant neoplasm of cervix: Secondary | ICD-10-CM | POA: Insufficient documentation

## 2022-07-25 DIAGNOSIS — N943 Premenstrual tension syndrome: Secondary | ICD-10-CM | POA: Diagnosis not present

## 2022-07-25 DIAGNOSIS — F439 Reaction to severe stress, unspecified: Secondary | ICD-10-CM

## 2022-07-25 MED ORDER — SERTRALINE HCL 50 MG PO TABS: 50.0000 mg | ORAL_TABLET | Freq: Every day | ORAL | 1 refills | Status: DC

## 2022-07-25 MED ORDER — VALACYCLOVIR HCL 500 MG PO TABS: 500.0000 mg | ORAL_TABLET | Freq: Two times a day (BID) | ORAL | 2 refills | Status: DC

## 2022-07-25 NOTE — Patient Instructions (Addendum)
Sertraline Tablets What is this medication? SERTRALINE (SER tra leen) treats depression, anxiety, obsessive-compulsive disorder (OCD), post-traumatic stress disorder (PTSD), and premenstrual dysphoric disorder (PMDD). It increases the amount of serotonin in the brain, a hormone that helps regulate mood. It belongs to a group of medications called SSRIs. This medicine may be used for other purposes; ask your health care provider or pharmacist if you have questions. COMMON BRAND NAME(S): Zoloft What should I tell my care team before I take this medication? They need to know if you have any of these conditions: Bleeding disorders Bipolar disorder or a family history of bipolar disorder Frequently drink alcohol Glaucoma Heart disease High blood pressure History of irregular heartbeat History of low levels of calcium, magnesium, or potassium in the blood Liver disease Receiving electroconvulsive therapy Seizures Suicidal thoughts, plans, or attempt by you or a family member Take medications that prevent or treat blood clots Thyroid disease An unusual or allergic reaction to sertraline, other medications, foods, dyes, or preservatives Pregnant or trying to get pregnant Breastfeeding How should I use this medication? Take this medication by mouth with a glass of water. Take it as directed on the prescription label at the same time every day. You can take it with or without food. If it upsets your stomach, take it with food. Do not take your medication more often than directed. Keep taking this medication unless your care team tells you to stop. Stopping it too quickly can cause serious side effects. It can also make your condition worse. A special MedGuide will be given to you by the pharmacist with each prescription and refill. Be sure to read this information carefully each time. Talk to your care team about the use of this medication in children. While it may be prescribed for children as  young as 7 years for selected conditions, precautions do apply. Overdosage: If you think you have taken too much of this medicine contact a poison control center or emergency room at once. NOTE: This medicine is only for you. Do not share this medicine with others. What if I miss a dose? If you miss a dose, take it as soon as you can. If it is almost time for your next dose, take only that dose. Do not take double or extra doses. What may interact with this medication? Do not take this medication with any of the following: Cisapride Dronedarone Linezolid MAOIs, such as Carbex, Eldepryl, Marplan, Nardil, and Parnate Methylene blue (injected into a vein) Pimozide Thioridazine This medication may also interact with the following: Alcohol Amphetamines Aspirin and aspirin-like medications Certain medications for fungal infections, such as ketoconazole, fluconazole, posaconazole, itraconazole Certain medications for irregular heart beat, such as flecainide, quinidine, propafenone Certain medications for mental health conditions Certain medications for migraine headaches, such as almotriptan, eletriptan, frovatriptan, naratriptan, rizatriptan, sumatriptan, zolmitriptan Certain medications for seizures, such as carbamazepine, valproic acid, phenytoin Certain medications for sleep Certain medications that prevent or treat blood clots, such as warfarin, enoxaparin, dalteparin Cimetidine Digoxin Diuretics Fentanyl Isoniazid Lithium NSAIDs, medications for pain and inflammation, such as ibuprofen or naproxen Other medications that cause heart rhythm changes, such as dofetilide Rasagiline Safinamide Supplements, such as St. John's wort, kava kava, valerian Tolbutamide Tramadol Tryptophan This list may not describe all possible interactions. Give your health care provider a list of all the medicines, herbs, non-prescription drugs, or dietary supplements you use. Also tell them if you smoke,  drink alcohol, or use illegal drugs. Some items may interact with your medicine.  What should I watch for while using this medication? Tell your care team if your symptoms do not get better or if they get worse. Visit your care team for regular checks on your progress. Because it may take several weeks to see the full effects of this medication, it is important to continue your treatment as prescribed by your care team. Patients and their families should watch out for new or worsening thoughts of suicide or depression. Also watch out for sudden changes in feelings such as feeling anxious, agitated, panicky, irritable, hostile, aggressive, impulsive, severely restless, overly excited and hyperactive, or not being able to sleep. If this happens, especially at the beginning of treatment or after a change in dose, call your care team. This medication may affect your coordination, reaction time, or judgment. Do not drive or operate machinery until you know how this medication affects you. Sit or stand up slowly to reduce the risk of dizzy or fainting spells. Drinking alcohol with this medication can increase the risk of these side effects. Your mouth may get dry. Chewing sugarless gum or sucking hard candy, and drinking plenty of water may help. Contact your care team if the problem does not go away or is severe. What side effects may I notice from receiving this medication? Side effects that you should report to your care team as soon as possible: Allergic reactions--skin rash, itching, hives, swelling of the face, lips, tongue, or throat Bleeding--bloody or black, tar-like stools, red or dark brown urine, vomiting blood or brown material that looks like coffee grounds, small red or purple spots on skin, unusual bleeding or bruising Heart rhythm changes--fast or irregular heartbeat, dizziness, feeling faint or lightheaded, chest pain, trouble breathing Low sodium level--muscle weakness, fatigue, dizziness,  headache, confusion Serotonin syndrome--irritability, confusion, fast or irregular heartbeat, muscle stiffness, twitching muscles, sweating, high fever, seizure, chills, vomiting, diarrhea Sudden eye pain or change in vision such as blurred vision, seeing halos around lights, vision loss Thoughts of suicide or self-harm, worsening mood Side effects that usually do not require medical attention (report these to your care team if they continue or are bothersome): Change in sex drive or performance Diarrhea Excessive sweating Nausea Tremors or shaking Upset stomach This list may not describe all possible side effects. Call your doctor for medical advice about side effects. You may report side effects to FDA at 1-800-FDA-1088. Where should I keep my medication? Keep out of the reach of children and pets. Store at room temperature between 20 and 25 degrees C (68 and 77 degrees F). Get rid of any unused medication after the expiration date. To get rid of medications that are no longer needed or expired: Take the medication to a medication take-back program. Check with your pharmacy or law enforcement to find a location. If you cannot return the medication, check the label or package insert to see if the medication should be thrown out in the garbage or flushed down the toilet. If you are not sure, ask your care team. If it is safe to put in the trash, empty the medication out of the container. Mix the medication with cat litter, dirt, coffee grounds, or other unwanted substance. Seal the mixture in a bag or container. Put it in the trash. NOTE: This sheet is a summary. It may not cover all possible information. If you have questions about this medicine, talk to your doctor, pharmacist, or health care provider.  2023 Elsevier/Gold Standard (2022-01-10 00:00:00)   Surgery to Prevent Pregnancy  Sterilization is surgery to prevent pregnancy. Sterilization is permanent. It should only be done if you are  sure that you do not want to have children. For females, the fallopian tubes are either blocked or closed off. When the fallopian tubes are closed, the eggs that the ovaries release cannot enter the uterus, sperm cannot reach the eggs, and pregnancy is prevented. For males, the vas deferens is cut and then tied or burned (cauterized). The vas deferens is a tube that carries sperm from the testicles. This procedure prevents pregnancy by blocking sperm from going through the vas deferens and penis during ejaculation. Types of sterilization     For females, the surgeries include: Laparoscopic tubal ligation. In this surgery, the fallopian tubes are tied off, sealed with heat, or blocked with a clip, ring, or clamp. A small portion of each fallopian tube may also be removed. This surgery is done through several small cuts (incisions) with special instruments that are inserted into the abdomen. Postpartum tubal ligation. This is also called a mini-laparotomy. This surgery is done right after childbirth or 1 or 2 days after childbirth. In this surgery, the fallopian tubes are tied off, sealed with heat, or blocked with a clip, ring, or clamp. A small portion of each fallopian tube may also be removed. The surgery is done through a single incision in the abdomen. Tubal ligation during a C-section. In this surgery, the fallopian tubes are tied off, sealed with heat, or blocked with a clip, ring, or clamp. A small portion of each fallopian tube may also be removed. The surgery is done at the same time as a C-section delivery. For males, the surgeries include: Incision vasectomy. In this surgery, one or two small incisions are made in the scrotum. The vas deferens will be pulled out of the scrotum and cut. The vas deferens will be tied off or sealed with heat and placed back into your scrotum. The incision will be closed with absorbable stitches (sutures). No scalpel vasectomy. In this surgery, a punctured  opening is made in the scrotum. The vas deferens will be pulled out of the scrotum and cut. The vas deferens will be tied off or sealed with heat and placed back into your scrotum. The opening is small and will not require sutures. What are the benefits of sterilization? It is usually effective for a lifetime. The procedures are generally safe. For females, sterilization does not affect the hormones, like other types of birth control. Because of this, menstrual periods will not be affected. For both males and females, sexual desire and sexual performance will not be affected. What are the disadvantages of sterilization? Risks from the surgery Generally, sterilization is safe. Complications are rare. However, there are some risks. They include: Bleeding. Infection. Reaction to medicine used during the procedure. Injury to surrounding organs. Risks after sterilization After a successful surgery, you may have other problems. Female sterilization risks may include: Failure of the procedure. Sterilization is nearly 100% effective, but it can fail. In rare cases, the fallopian tubes can grow back together over time. If this happens, a female will be able to get pregnant again. A higher risk of having an ectopic pregnancy. An ectopic pregnancy is a pregnancy that grows outside of the uterus. This kind of pregnancy can lead to serious bleeding if it is not treated. Female sterilization risks may include: Bleeding and swelling of the scrotum. Failure of the procedure. There is a very small chance that the tied or cauterized ends  of the vas deferens may reconnect (recanalization). If this happens, a female could still make a female pregnant. Other risks may include: A risk that you may change your mind and decide you want have children. Sterilization may be reversed, but a reversal is not always successful. Lack of protection against sexually transmitted infections (STIs). What happens during the  procedure? The steps of the procedure depend on the type of sterilization you are having. The procedure may vary among health care providers and hospitals. Questions to ask your health care provider How effective are sterilization procedures? What type of procedure is right for me? Is it possible to reverse the procedure if I change my mind? What can I expect after the procedure? Where to find more information [employer]: Robo[employer].cy [place] Department of Health and Human Services: ConventionalMedicines.si [facility]: www.urologyhealth.org Summary Sterilization is surgery to prevent pregnancy. There are different types of sterilization surgeries. Sterilization may be reversed, but a reversal is not always successful. Sterilization does not protect against STIs. This information is not intended to replace advice given to you by your health care provider. Make sure you discuss any questions you have with your health care provider. Document Revised: 03/13/2020 Document Reviewed: 03/13/2020 Elsevier Patient Education  2023 Elsevier Inc.   Laparoscopic Tubal Ligation Laparoscopic tubal ligation is a procedure to close the fallopian tubes. This is done to prevent pregnancy. When the fallopian tubes are closed, the eggs that your ovaries release cannot enter the uterus, and sperm cannot reach the released eggs. You should not have this procedure if you want to get pregnant someday or if you are unsure about having more children. Tell a health care provider about: Any allergies you have. All medicines you are taking, including vitamins, herbs, eye drops, creams, and over-the-counter medicines. Any problems you or family members have had with anesthetic medicines. Any blood disorders you have. Any surgeries you have had. Any medical conditions you have. Whether you are pregnant or may be pregnant. Any past pregnancies. What are the  risks? Generally, this is a safe procedure. However, problems may occur, including: Infection. Bleeding. Injury to other organs in the abdomen. Side effects from anesthetic medicines. Failure of the procedure. This procedure can increase your risk of an ectopic pregnancy. This is a pregnancy in which a fertilized egg attaches to the outside of the uterus. What happens before the procedure? Staying hydrated Follow instructions from your health care provider about hydration, which may include: Up to 2 hours before the procedure - you may continue to drink clear liquids, such as water, clear fruit juice, black coffee, and plain tea. Eating and drinking restrictions Follow instructions from your health care provider about eating and drinking, which may include: 8 hours before the procedure - stop eating heavy meals or foods, such as meat, fried foods, or fatty foods. 6 hours before the procedure - stop eating light meals or foods, such as toast or cereal. 6 hours before the procedure - stop drinking milk or drinks that contain milk. 2 hours before the procedure - stop drinking clear liquids. Medicines Ask your health care provider about: Changing or stopping your regular medicines. This is especially important if you are taking diabetes medicines or blood thinners. Taking medicines such as aspirin and ibuprofen. These medicines can thin your blood. Do not take these medicines unless your health care provider tells you to take them. Taking over-the-counter medicines, vitamins, herbs, and supplements. Surgery safety Ask your health care  provider: How your surgery site will be marked. What steps will be taken to help prevent infection. These steps may include: Removing hair at the surgery site. Washing skin with a germ-killing soap. Taking antibiotic medicine. General instructions Do not use any products that contain nicotine or tobacco for at least 4 weeks before the procedure. These  products include cigarettes, chewing tobacco, and vaping devices, such as e-cigarettes. If you need help quitting, ask your health care provider. Plan to have someone take you home from the hospital. If you will be going home right after the procedure, plan to have a responsible adult care for you for the time you are told. This is important. What happens during the procedure?     An IV will be inserted into one of your veins. You will be given one or more of the following: A medicine to help you relax (sedative). A medicine to numb the area (local anesthetic). A medicine to make you fall asleep (general anesthetic). A medicine that is injected into an area of your body to numb everything below the injection site (regional anesthetic). Your bladder may be emptied with a small tube (catheter). If you have been given a general anesthetic, a tube will be put down your throat to help you breathe. Two small incisions will be made in your lower abdomen and near your belly button. Your abdomen will be inflated with a gas. This will let the surgeon see better and will give the surgeon room to work. A lighted tube with camera (laparoscope) will be inserted into your abdomen through one of the incisions. Small instruments will be inserted through the other incision. The fallopian tubes will be tied off, burned (cauterized), or blocked with a clip, ring, or clamp. A small portion in the center of each fallopian tube may be removed. The gas will be released from the abdomen. The incisions will be closed with stitches (sutures). A bandage (dressing) will be placed over the incisions. The procedure may vary among health care providers and hospitals. What happens after the procedure? Your blood pressure, heart rate, breathing rate, and blood oxygen level will be monitored until you leave the hospital. You will be given medicine to help with pain, nausea, and vomiting as needed. You may have vaginal  discharge after the procedure. You may need to wear a sanitary napkin. If you were given a sedative during the procedure, it can affect you for several hours. Do not drive or operate machinery until your health care provider says that it is safe. Summary Laparoscopic tubal ligation is a procedure that is done to prevent pregnancy. You should not have this procedure if you want to get pregnant someday or if you are unsure about having more children. The procedure is done using a thin, lighted tube (laparoscope) with a camera attached that will be inserted into your abdomen through an incision. After the procedure you will be given medicine to help with pain, nausea, and vomiting as needed. Plan to have someone take you home from the hospital. This information is not intended to replace advice given to you by your health care provider. Make sure you discuss any questions you have with your health care provider. Document Revised: 02/27/2020 Document Reviewed: 02/27/2020 Elsevier Patient Education  [place].   Intrauterine Device Information An intrauterine device (IUD) is a medical device that is inserted into the uterus to prevent pregnancy. It is a small, T-shaped device that has one or two nylon strings hanging  down from it. The strings hang out of the lower part of the uterus (cervix) to allow for future IUD removal. There are two types of IUDs: Hormone IUD. This type of IUD is made of plastic and contains the hormone progestin (synthetic progesterone). A hormone IUD may last 3-5 years. Copper IUD. This type of IUD has copper wire wrapped around it. A copper IUD may last up to 10 years. How is an IUD inserted? An IUD is inserted through the vagina, through the cervix, and into the uterus with a minor medical procedure. The procedure for IUD insertion may vary among health care providers and hospitals. How does an IUD work? Synthetic progesterone in a hormonal IUD prevents pregnancy  by: Thickening cervical mucus to prevent sperm from entering the uterus. Thinning the uterine lining to prevent a fertilized egg from being implanted there. Copper in a copper IUD prevents pregnancy by making the uterus and fallopian tubes produce a fluid that kills sperm. What are the advantages of an IUD? Advantages of either type of IUD An IUD: Is highly effective in preventing pregnancy. Is reversible. You can become pregnant shortly after the IUD is removed. Is low-maintenance and can stay in place for a long time. Has no estrogen-related side effects. Can be used when breastfeeding. Is not associated with weight gain. Can be inserted right after childbirth, an abortion, or a miscarriage. Advantages of a hormone IUD If it is inserted within 7 days of your period starting, it works right after it has been inserted. If the hormone IUD is inserted at any other time in your cycle, you will need to use a backup method of birth control for 7 days after insertion. It can make menstrual periods lighter or stop completely. It can reduce menstrual cramping and other discomforts from menstrual periods. It can be used for 3-5 years, depending on which IUD you have. Advantages of a copper IUD It works right after it is inserted. It can be used as a form of emergency birth control if it is inserted within 5 days after having unprotected sex. It does not interfere with your body's natural hormones. It can be used for up to 10 years. What are the disadvantages of an IUD? An IUD may cause irregular menstrual bleeding for a period of time after insertion. It is common to have pain during insertion and have cramping and vaginal bleeding after insertion. An IUD may cut the uterus (uterine perforation) when it is inserted. This is rare. Pelvic inflammatory disease (PID) may happen after insertion of an IUD. PID is an infection in the uterus and fallopian tubes. The IUD does not cause the infection. The  infection is usually from an unknown sexually transmitted infection (STI). This is rare, and it usually happens during the first 20 days after the IUD is inserted. A copper IUD can make your menstrual flow heavier and more painful. IUDs cannot prevent sexually transmitted infections (STIs). How is an IUD removed?  You will lie on your back with your knees bent and your feet in footrests (stirrups). A device will be inserted into your vagina to spread apart the vaginal walls (speculum). This will allow your health care provider to see the strings attached to the IUD. Your health care provider will use a small instrument (forceps) to grasp the IUD strings and will pull firmly until the IUD is removed. You may have some discomfort when the IUD is removed. Your health care provider may recommend taking over-the-counter pain relievers,  such as ibuprofen, before the procedure. You may also have minor spotting for a few days after the procedure. The procedure for IUD removal may vary among health care providers and hospitals. Is an IUD right for me? If you are interested in an IUD, discuss it with your health care provider. He or she will make sure you are a good candidate for an IUD and will let you know more about the advantages, disadvantage, and possible side effects. This will allow you to make a decision about the device. Summary An intrauterine device (IUD) is a medical device that is inserted in the uterus to prevent pregnancy. It is a small, T-shaped device that has one or two nylon strings hanging down from it. A hormone IUD contains the hormone progestin (synthetic progesterone). A copper IUD has copper wire wrapped around it. Synthetic progesterone in a hormone IUD prevents pregnancy by thickening cervical mucus and thinning the walls of the uterus. Copper in a copper IUD prevents pregnancy by making the uterus and fallopian tubes produce a fluid that kills sperm. A hormone IUD can be left in  place for 3-5 years. A copper IUD can be left in place for up to 10 years. An IUD is inserted and removed by a health care provider. You may feel some pain during insertion and removal. Your health care provider may recommend taking over-the-counter pain medicine, such as ibuprofen, before an IUD procedure. This information is not intended to replace advice given to you by your health care provider. Make sure you discuss any questions you have with your health care provider. Document Revised: 12/24/2019 Document Reviewed: 12/24/2019 Elsevier Patient Education  [id number].

## 2022-07-28 LAB — CYTOLOGY - PAP
Adequacy: ABSENT
Comment: NEGATIVE
Diagnosis: NEGATIVE
High risk HPV: NEGATIVE

## 2022-07-31 ENCOUNTER — Encounter: Payer: Self-pay | Admitting: Obstetrics and Gynecology

## 2022-07-31 NOTE — Telephone Encounter (Signed)
Please contact patient by phone regarding her My Chart message.   I recommend she stop taking the Zoloft.  I expect her symptoms to resolve with discontinuation of the medication.   Please discontinue the medication in her medication list.  If she would like to do any additional treatment for PMDD, anxiety, or stress, I recommend she see a PCP or psychiatrist to prescribe medication.

## 2022-07-31 NOTE — Telephone Encounter (Signed)
FYI. Pt notified by phone call and voiced understanding and was appreciative of the call. States she has family/friends who go to [facility] and [facility] and will get that process started asap. Rx discontinued in EMR. Will close encounter.

## 2022-08-22 NOTE — Progress Notes (Deleted)
GYNECOLOGY  VISIT   HPI: 42 y.o.   Domestic Partner  Caucasian  female   [phone] with No LMP recorded.   here for   6 wk recheck  GYNECOLOGIC HISTORY: No LMP recorded. 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   CONSTIPATION 12/16/2008   OBESITY 10/12/2008   MIGRAINE, COMMON 09/03/2008    Past Medical History:  Diagnosis Date   HSV infection    type II   Migraine    without aura   Vaginal Pap smear, abnormal     Past Surgical History:  Procedure Laterality Date   CESAREAN SECTION     DILATION AND EVACUATION N/A 09/21/2015   Procedure: DILATATION AND EVACUATION;  Surgeon: Donnamae Jude, MD;  Location: [facility];  Service: Gynecology;  Laterality: N/A;   THERAPEUTIC ABORTION     WISDOM TOOTH EXTRACTION      Current Outpatient Medications  Medication Sig Dispense Refill   fluticasone (FLONASE) 50 MCG/ACT nasal spray Place 2 sprays into both nostrils daily. 16 g 6   ibuprofen (ADVIL) 600 MG tablet Take 1 tablet (600 mg total) by mouth every 6 (six) hours as needed. 30 tablet 0   prenatal vitamin w/FE, FA (PRENATAL 1 + 1) 27-1 MG TABS tablet Take 1 tablet by mouth daily at 12 noon. 30 tablet 11   sodium chloride (OCEAN) 0.65 % SOLN nasal spray Place 1-2 sprays into both nostrils as needed for congestion. Reported on 10/07/2015     valACYclovir (VALTREX) 500 MG tablet Take 1 tablet (500 mg total) by mouth 2 (two) times daily. Take for 3 days as needed. 30 tablet 2   No current facility-administered medications for this visit.     ALLERGIES: Shrimp  [shellfish allergy]  Family History  Problem Relation Age of Onset   Cancer Father    Heart disease Maternal Aunt    Stroke Maternal Grandmother    Heart disease Maternal Grandfather     Social History   Socioeconomic History   Marital status: Soil scientist    Spouse name: Not on file   Number of children: Not on file   Years of education: Not on file   Highest education level: Not on file  Occupational History   Not on file  Tobacco Use   Smoking status: Former    Packs/day: 0.50    Types: Cigarettes   Smokeless tobacco: Current  Vaping Use   Vaping Use: Former   Quit date: 07/26/2018  Substance and Sexual Activity   Alcohol use: No   Drug use: Never   Sexual activity: Yes    Birth control/protection: None  Other Topics Concern   Not on file  Social History Narrative   Not on file   Social Determinants  of Health   Financial Resource Strain: Not on file  Food Insecurity: Food Insecurity Present (08/30/2018)   Hunger Vital Sign    Worried About Running Out of Food in the Last Year: Sometimes true    Ran Out of Food in the Last Year: Never true  Transportation Needs: No Transportation Needs (08/30/2018)   PRAPARE - Hydrologist (Medical): No    Lack of Transportation (Non-Medical): No  Physical Activity: Not on file  Stress: Not on file  Social Connections: Not on file  Intimate Partner Violence: Not on file    Review of Systems  PHYSICAL EXAMINATION:    There were no vitals taken for this visit.    General appearance: alert, cooperative and appears stated age Head: Normocephalic, without obvious abnormality, atraumatic Neck: no adenopathy, supple, symmetrical, trachea midline and thyroid normal to inspection and palpation Lungs: clear to auscultation bilaterally Breasts: normal appearance, no masses or tenderness, No nipple retraction or dimpling, No nipple discharge or bleeding, No axillary or supraclavicular adenopathy Heart:  regular rate and rhythm Abdomen: soft, non-tender, no masses,  no organomegaly Extremities: extremities normal, atraumatic, no cyanosis or edema Skin: Skin color, texture, turgor normal. No rashes or lesions Lymph nodes: Cervical, supraclavicular, and axillary nodes normal. No abnormal inguinal nodes palpated Neurologic: Grossly normal  Pelvic: External genitalia:  no lesions              Urethra:  normal appearing urethra with no masses, tenderness or lesions              Bartholins and Skenes: normal                 Vagina: normal appearing vagina with normal color and discharge, no lesions              Cervix: no lesions                Bimanual Exam:  Uterus:  normal size, contour, position, consistency, mobility, non-tender              Adnexa: no mass, fullness, tenderness              Rectal exam: {yes no:[id number]}.  Confirms.              Anus:  normal sphincter tone, no lesions  Chaperone was present for exam:  ***  ASSESSMENT     PLAN     An After Visit Summary was printed and given to the patient.  ______ minutes face to face time of which over 50% was spent in counseling.

## 2022-09-05 ENCOUNTER — Ambulatory Visit: Payer: Medicaid Other | Admitting: Obstetrics and Gynecology

## 2022-09-29 NOTE — Telephone Encounter (Signed)
FYI. Please let me know if any recommendations and also if you would like pt to schedule another 6 week recheck appt. Thanks.

## 2022-10-31 NOTE — Progress Notes (Signed)
GYNECOLOGY  VISIT   HPI: 42 y.o.   Domestic Partner  Caucasian  female   [phone] with Patient's last menstrual period was 10/28/2022.   here for   12 week f/u for Zoloft.  Taking Zoloft for PMDD and situational stress.  She took it for about 4 days, stopped, and then tried again at a lower dose of 25 mg and at night for the last 2 months.   Feeling better.  Some panic and anxiety but much less.  Feels calmer. Doing projects more easily.  Work is going better.  Not experiencing sense of doom.  Not sleeping as well as she would like.  Not having to commute to work and is much happier.   Likes to hike.   She would like to increase Zoloft to 50 mg nightly.   Considered seeing psychiatry group for counseling.   GYNECOLOGIC HISTORY: Patient's last menstrual period was 10/28/2022. 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   CONSTIPATION 12/16/2008   OBESITY 10/12/2008   MIGRAINE, COMMON 09/03/2008    Past Medical History:  Diagnosis Date   HSV infection    type II   Migraine    without aura   Vaginal Pap smear, abnormal     Past Surgical History:  Procedure Laterality Date   CESAREAN SECTION     DILATION AND EVACUATION N/A 09/21/2015   Procedure: DILATATION AND EVACUATION;  Surgeon: Reva Bores, MD;  Location: [facility];  Service: Gynecology;  Laterality: N/A;   THERAPEUTIC ABORTION     WISDOM TOOTH EXTRACTION      Current Outpatient Medications  Medication Sig Dispense Refill   fluticasone (FLONASE) 50 MCG/ACT nasal spray Place 2 sprays  into both nostrils daily. 16 g 6   ibuprofen (ADVIL) 600 MG tablet Take 1 tablet (600 mg total) by mouth every 6 (six) hours as needed. 30 tablet 0   levocetirizine (XYZAL) 2.5 MG/5ML solution Take 2.5 mg by mouth every evening.     prenatal vitamin w/FE, FA (PRENATAL 1 + 1) 27-1 MG TABS tablet Take 1 tablet by mouth daily at 12 noon. 30 tablet 11   sertraline (ZOLOFT) 25 MG tablet      valACYclovir (VALTREX) 500 MG tablet Take 1 tablet (500 mg total) by mouth 2 (two) times daily. Take for 3 days as needed. 30 tablet 2   sodium chloride (OCEAN) 0.65 % SOLN nasal spray Place 1-2 sprays into both nostrils as needed for congestion. Reported on 10/07/2015 (Patient not taking: Reported on 11/14/2022)     No current facility-administered medications for this visit.     ALLERGIES: Shrimp [shellfish allergy]  Family History  Problem Relation Age of Onset   Cancer Father    Heart disease Maternal Aunt  Stroke Maternal Grandmother    Heart disease Maternal Grandfather     Social History   Socioeconomic History   Marital status: Media planner    Spouse name: Not on file   Number of children: Not on file   Years of education: Not on file   Highest education level: Not on file  Occupational History   Not on file  Tobacco Use   Smoking status: Former    Packs/day: .5    Types: Cigarettes   Smokeless tobacco: Current  Vaping Use   Vaping Use: Former   Quit date: 07/26/2018  Substance and Sexual Activity   Alcohol use: No   Drug use: Never   Sexual activity: Yes    Birth control/protection: None  Other Topics Concern   Not on file  Social History Narrative   Not on file   Social Determinants of Health   Financial Resource Strain: Not on file  Food Insecurity: Food Insecurity Present (08/30/2018)   Hunger Vital Sign    Worried About Running Out of Food in the Last Year: Sometimes true    Ran Out of Food in the Last Year: Never true  Transportation Needs: No Transportation Needs  (08/30/2018)   PRAPARE - Administrator, Civil Service (Medical): No    Lack of Transportation (Non-Medical): No  Physical Activity: Not on file  Stress: Not on file  Social Connections: Not on file  Intimate Partner Violence: Not on file    Review of Systems  All other systems reviewed and are negative.   PHYSICAL EXAMINATION:    BP 122/76 (BP Location: Left Arm, Patient Position: Sitting, Cuff Size: Large)   Pulse 87   Wt 214 lb (97.1 kg)   LMP 10/28/2022   SpO2 97%   BMI 32.07 kg/m     General appearance: alert, cooperative and appears stated age  ASSESSMENT  PMDD.  Situational stress.  Encounter for medication monitoring.   PLAN  Increase Zoloft to 50 mg nightly.  #90 RF 3.  Her goal is to increase exercise. She will consider counseling/coaching.  Annual exam in January, 2025.    30 min  total time was spent for this patient encounter, including preparation, face-to-face counseling with the patient, coordination of care, and documentation of the encounter.

## 2022-11-14 ENCOUNTER — Ambulatory Visit (INDEPENDENT_AMBULATORY_CARE_PROVIDER_SITE_OTHER): Payer: BC Managed Care – PPO | Admitting: Obstetrics and Gynecology

## 2022-11-14 ENCOUNTER — Encounter: Payer: Self-pay | Admitting: Obstetrics and Gynecology

## 2022-11-14 VITALS — BP 122/76 | HR 87 | Wt 214.0 lb

## 2022-11-14 DIAGNOSIS — Z5181 Encounter for therapeutic drug level monitoring: Secondary | ICD-10-CM | POA: Diagnosis not present

## 2022-11-14 DIAGNOSIS — F3281 Premenstrual dysphoric disorder: Secondary | ICD-10-CM

## 2022-11-14 DIAGNOSIS — F439 Reaction to severe stress, unspecified: Secondary | ICD-10-CM

## 2022-11-14 MED ORDER — SERTRALINE HCL 50 MG PO TABS: 50.0000 mg | ORAL_TABLET | Freq: Every day | ORAL | 3 refills | Status: DC

## 2022-11-17 ENCOUNTER — Telehealth: Payer: BC Managed Care – PPO | Admitting: Nurse Practitioner

## 2022-11-17 DIAGNOSIS — B9789 Other viral agents as the cause of diseases classified elsewhere: Secondary | ICD-10-CM

## 2022-11-17 DIAGNOSIS — J329 Chronic sinusitis, unspecified: Secondary | ICD-10-CM | POA: Diagnosis not present

## 2022-11-17 MED ORDER — IPRATROPIUM BROMIDE 0.03 % NA SOLN: 2.0000 | Freq: Two times a day (BID) | NASAL | 12 refills | Status: AC

## 2022-11-17 NOTE — Progress Notes (Signed)
E-Visit for Sinus Problems  We are sorry that you are not feeling well.  Here is how we plan to help!  Since you were improving while on the Dayquil and Nyquil you should continue to use this or another over the counter decongestant to support your symptoms.   We do not typically recommend antibiotics prior to 7-10 days of congestion  For sore throat warm salt water gargles and ibuprofen will help.   Providers prescribe antibiotics to treat infections caused by bacteria. Antibiotics are very powerful in treating bacterial infections when they are used properly. To maintain their effectiveness, they should be used only when necessary. Overuse of antibiotics has resulted in the development of superbugs that are resistant to treatment!    After careful review of your answers, I would not recommend an antibiotic for your condition.  Antibiotics are not effective against viruses and therefore should not be used to treat them. Common examples of infections caused by viruses include colds and flu   Based on what you have shared with me it looks like you have sinusitis.  Sinusitis is inflammation and infection in the sinus cavities of the head.  Based on your presentation I believe you most likely have Acute Viral Sinusitis.This is an infection most likely caused by a virus. There is not specific treatment for viral sinusitis other than to help you with the symptoms until the infection runs its course. Saline nasal spray help and can safely be used as often as needed for congestion, I have prescribed: Ipratropium Bromide nasal spray 0.03% 2 sprays in eah nostril 2-3 times a day  Some authorities believe that zinc sprays or the use of Echinacea may shorten the course of your symptoms.  Sinus infections are not as easily transmitted as other respiratory infection, however we still recommend that you avoid close contact with loved ones, especially the very young and elderly.  Remember to wash your hands  thoroughly throughout the day as this is the number one way to prevent the spread of infection!  Home Care: Only take medications as instructed by your medical team. Do not take these medications with alcohol. A steam or ultrasonic humidifier can help congestion.  You can place a towel over your head and breathe in the steam from hot water coming from a faucet. Avoid close contacts especially the very young and the elderly. Cover your mouth when you cough or sneeze. Always remember to wash your hands.  Get Help Right Away If: You develop worsening fever or sinus pain. You develop a severe head ache or visual changes. Your symptoms persist after you have completed your treatment plan.  Make sure you Understand these instructions. Will watch your condition. Will get help right away if you are not doing well or get worse.   Thank you for choosing an e-visit.  Your e-visit answers were reviewed by a board certified advanced clinical practitioner to complete your personal care plan. Depending upon the condition, your plan could have included both over the counter or prescription medications.  Please review your pharmacy choice. Make sure the pharmacy is open so you can pick up prescription now. If there is a problem, you may contact your provider through [employer] and have the prescription routed to another pharmacy.  Your safety is important to [place]. If you have drug allergies check your prescription carefully.   For the next 24 hours you can use MyChart to ask questions about today's visit, request a non-urgent call back, or  ask for a work or school excuse. You will get an email in the next two days asking about your experience. I hope that your e-visit has been valuable and will speed your recovery.  I spent approximately 5 minutes reviewing the patient's history, current symptoms and coordinating their care today.

## 2023-04-12 DIAGNOSIS — E781 Pure hyperglyceridemia: Secondary | ICD-10-CM | POA: Diagnosis not present

## 2023-04-12 DIAGNOSIS — Z130 Encounter for screening for diseases of the blood and blood-forming organs and certain disorders involving the immune mechanism: Secondary | ICD-10-CM | POA: Diagnosis not present

## 2023-04-12 DIAGNOSIS — E559 Vitamin D deficiency, unspecified: Secondary | ICD-10-CM | POA: Diagnosis not present

## 2023-04-12 DIAGNOSIS — R7309 Other abnormal glucose: Secondary | ICD-10-CM | POA: Diagnosis not present

## 2023-04-12 DIAGNOSIS — N951 Menopausal and female climacteric states: Secondary | ICD-10-CM | POA: Diagnosis not present

## 2023-04-12 DIAGNOSIS — Z1329 Encounter for screening for other suspected endocrine disorder: Secondary | ICD-10-CM | POA: Diagnosis not present

## 2023-04-12 DIAGNOSIS — R635 Abnormal weight gain: Secondary | ICD-10-CM | POA: Diagnosis not present

## 2023-04-12 DIAGNOSIS — Z6833 Body mass index (BMI) 33.0-33.9, adult: Secondary | ICD-10-CM | POA: Diagnosis not present

## 2023-04-17 DIAGNOSIS — N951 Menopausal and female climacteric states: Secondary | ICD-10-CM | POA: Diagnosis not present

## 2023-04-17 DIAGNOSIS — N898 Other specified noninflammatory disorders of vagina: Secondary | ICD-10-CM | POA: Diagnosis not present

## 2023-04-17 DIAGNOSIS — E669 Obesity, unspecified: Secondary | ICD-10-CM | POA: Diagnosis not present

## 2023-04-17 DIAGNOSIS — Z1331 Encounter for screening for depression: Secondary | ICD-10-CM | POA: Diagnosis not present

## 2023-04-17 DIAGNOSIS — R232 Flushing: Secondary | ICD-10-CM | POA: Diagnosis not present

## 2023-04-24 DIAGNOSIS — E559 Vitamin D deficiency, unspecified: Secondary | ICD-10-CM | POA: Diagnosis not present

## 2023-04-24 DIAGNOSIS — Z6833 Body mass index (BMI) 33.0-33.9, adult: Secondary | ICD-10-CM | POA: Diagnosis not present

## 2023-04-24 DIAGNOSIS — E781 Pure hyperglyceridemia: Secondary | ICD-10-CM | POA: Diagnosis not present

## 2023-05-10 ENCOUNTER — Encounter: Payer: Self-pay | Admitting: Obstetrics and Gynecology

## 2023-05-10 ENCOUNTER — Other Ambulatory Visit: Payer: Self-pay | Admitting: Obstetrics and Gynecology

## 2023-05-10 MED ORDER — VALACYCLOVIR HCL 500 MG PO TABS: 500.0000 mg | ORAL_TABLET | Freq: Two times a day (BID) | ORAL | 0 refills | Status: AC

## 2023-05-10 NOTE — Progress Notes (Signed)
 Refill of Valtrex.

## 2023-07-10 ENCOUNTER — Other Ambulatory Visit: Payer: Self-pay

## 2023-07-10 NOTE — Telephone Encounter (Signed)
 Med refill request: sertraline 25 mg tab Last AEX: 07/25/2022 Next AEX: not schedule, sent message to schedule annual visit Last MMG (if hormonal med) Refill authorized: Last Rx sent #[phone] with 3 refills on 11/14/2022. Please approve or deny as appropriate

## 2023-07-11 MED ORDER — SERTRALINE HCL 50 MG PO TABS: 50.0000 mg | ORAL_TABLET | Freq: Every day | ORAL | 0 refills | Status: AC

## 2023-07-11 NOTE — Telephone Encounter (Signed)
 I spoke to the patient and she stated that she is taking sertraline  mg tablet. Please send to [facility].

## 2023-07-25 NOTE — Telephone Encounter (Signed)
Pt is currently taking 50 mg of sertraline per our last conversation.

## 2024-09-15 ENCOUNTER — Ambulatory Visit: Admitting: Obstetrics and Gynecology

## 2024-10-30 ENCOUNTER — Ambulatory Visit: Admitting: Obstetrics and Gynecology
